# Patient Record
Sex: Male | Born: 1950 | Race: White | Hispanic: No | Marital: Married | Smoking: Former smoker
Health system: Southern US, Community
[De-identification: ages and names within clinical notes are randomized; demographics above are authoritative.]

## PROBLEM LIST (undated history)

## (undated) DIAGNOSIS — E7800 Pure hypercholesterolemia, unspecified: Secondary | ICD-10-CM

## (undated) DIAGNOSIS — E119 Type 2 diabetes mellitus without complications: Secondary | ICD-10-CM

## (undated) DIAGNOSIS — I10 Essential (primary) hypertension: Secondary | ICD-10-CM

---

## 2022-05-24 ENCOUNTER — Emergency Department (HOSPITAL_COMMUNITY): Payer: Medicare Other

## 2022-05-24 ENCOUNTER — Observation Stay (HOSPITAL_COMMUNITY)
Admission: EM | Admit: 2022-05-24 | Discharge: 2022-05-25 | Disposition: A | Discharge status: Home / Self Care | Payer: Medicare Other | Attending: Internal Medicine | Admitting: Internal Medicine

## 2022-05-24 ENCOUNTER — Other Ambulatory Visit: Payer: Self-pay

## 2022-05-24 ENCOUNTER — Encounter (HOSPITAL_COMMUNITY): Payer: Self-pay | Admitting: Emergency Medicine

## 2022-05-24 ENCOUNTER — Emergency Department (HOSPITAL_BASED_OUTPATIENT_CLINIC_OR_DEPARTMENT_OTHER): Payer: Medicare Other

## 2022-05-24 DIAGNOSIS — R200 Anesthesia of skin: Secondary | ICD-10-CM

## 2022-05-24 DIAGNOSIS — I10 Essential (primary) hypertension: Secondary | ICD-10-CM

## 2022-05-24 DIAGNOSIS — I129 Hypertensive chronic kidney disease with stage 1 through stage 4 chronic kidney disease, or unspecified chronic kidney disease: Secondary | ICD-10-CM | POA: Insufficient documentation

## 2022-05-24 DIAGNOSIS — Z7982 Long term (current) use of aspirin: Secondary | ICD-10-CM | POA: Insufficient documentation

## 2022-05-24 DIAGNOSIS — E1122 Type 2 diabetes mellitus with diabetic chronic kidney disease: Secondary | ICD-10-CM | POA: Insufficient documentation

## 2022-05-24 DIAGNOSIS — Z7984 Long term (current) use of oral hypoglycemic drugs: Secondary | ICD-10-CM | POA: Diagnosis not present

## 2022-05-24 DIAGNOSIS — N1832 Chronic kidney disease, stage 3b: Secondary | ICD-10-CM | POA: Diagnosis not present

## 2022-05-24 DIAGNOSIS — G4730 Sleep apnea, unspecified: Secondary | ICD-10-CM

## 2022-05-24 DIAGNOSIS — Z87891 Personal history of nicotine dependence: Secondary | ICD-10-CM | POA: Insufficient documentation

## 2022-05-24 DIAGNOSIS — I639 Cerebral infarction, unspecified: Secondary | ICD-10-CM

## 2022-05-24 DIAGNOSIS — Z79899 Other long term (current) drug therapy: Secondary | ICD-10-CM | POA: Diagnosis not present

## 2022-05-24 DIAGNOSIS — G459 Transient cerebral ischemic attack, unspecified: Principal | ICD-10-CM | POA: Insufficient documentation

## 2022-05-24 HISTORY — DX: Type 2 diabetes mellitus without complications: E11.9

## 2022-05-24 HISTORY — DX: Essential (primary) hypertension: I10

## 2022-05-24 HISTORY — DX: Pure hypercholesterolemia, unspecified: E78.00

## 2022-05-24 LAB — COMPREHENSIVE METABOLIC PANEL
ALT: 20 U/L (ref 0–44)
AST: 20 U/L (ref 15–41)
Albumin: 4.2 g/dL (ref 3.5–5.0)
Alkaline Phosphatase: 80 U/L (ref 38–126)
Anion gap: 12 (ref 5–15)
BUN: 34 mg/dL — ABNORMAL HIGH (ref 8–23)
CO2: 21 mmol/L — ABNORMAL LOW (ref 22–32)
Calcium: 9.8 mg/dL (ref 8.9–10.3)
Chloride: 105 mmol/L (ref 98–111)
Creatinine, Ser: 1.83 mg/dL — ABNORMAL HIGH (ref 0.61–1.24)
GFR, Estimated: 39 mL/min — ABNORMAL LOW (ref >60)
Glucose, Bld: 187 mg/dL — ABNORMAL HIGH (ref 70–99)
Potassium: 4.4 mmol/L (ref 3.5–5.1)
Sodium: 138 mmol/L (ref 135–145)
Total Bilirubin: 0.5 mg/dL (ref 0.3–1.2)
Total Protein: 7.2 g/dL (ref 6.5–8.1)

## 2022-05-24 LAB — CBC WITH DIFFERENTIAL/PLATELET
Abs Immature Granulocytes: 0.03 10*3/uL (ref 0.00–0.07)
Basophils Absolute: 0 10*3/uL (ref 0.0–0.1)
Basophils Relative: 1 %
Eosinophils Absolute: 0.4 10*3/uL (ref 0.0–0.5)
Eosinophils Relative: 7 %
HCT: 47.4 % (ref 39.0–52.0)
Hemoglobin: 15.3 g/dL (ref 13.0–17.0)
Immature Granulocytes: 1 %
Lymphocytes Relative: 18 %
Lymphs Abs: 1.1 10*3/uL (ref 0.7–4.0)
MCH: 29.6 pg (ref 26.0–34.0)
MCHC: 32.3 g/dL (ref 30.0–36.0)
MCV: 91.7 fL (ref 80.0–100.0)
Monocytes Absolute: 0.5 10*3/uL (ref 0.1–1.0)
Monocytes Relative: 8 %
Neutro Abs: 4.1 10*3/uL (ref 1.7–7.7)
Neutrophils Relative %: 65 %
Platelets: 248 10*3/uL (ref 150–400)
RBC: 5.17 MIL/uL (ref 4.22–5.81)
RDW: 12.7 % (ref 11.5–15.5)
WBC: 6.2 10*3/uL (ref 4.0–10.5)
nRBC: 0 % (ref 0.0–0.2)

## 2022-05-24 LAB — PROTIME-INR
INR: 0.9 (ref 0.8–1.2)
Prothrombin Time: 12.5 seconds (ref 11.4–15.2)

## 2022-05-24 LAB — URINALYSIS, ROUTINE W REFLEX MICROSCOPIC
Bacteria, UA: NONE SEEN
Bilirubin Urine: NEGATIVE
Glucose, UA: >=500 mg/dL — AB
Hgb urine dipstick: NEGATIVE
Ketones, ur: NEGATIVE mg/dL
Leukocytes,Ua: NEGATIVE
Nitrite: NEGATIVE
Protein, ur: 100 mg/dL — AB
Specific Gravity, Urine: 1.016 (ref 1.005–1.030)
pH: 6 (ref 5.0–8.0)

## 2022-05-24 LAB — HEMOGLOBIN A1C
Hgb A1c MFr Bld: 8.3 % — ABNORMAL HIGH (ref 4.8–5.6)
Mean Plasma Glucose: 191.51 mg/dL

## 2022-05-24 LAB — CBG MONITORING, ED: Glucose-Capillary: 90 mg/dL (ref 70–99)

## 2022-05-24 LAB — APTT: aPTT: 27 seconds (ref 24–36)

## 2022-05-24 LAB — LIPID PANEL
Cholesterol: 263 mg/dL — ABNORMAL HIGH (ref 0–200)
HDL: 48 mg/dL (ref >40)
LDL Cholesterol: 188 mg/dL — ABNORMAL HIGH (ref 0–99)
Total CHOL/HDL Ratio: 5.5 RATIO
Triglycerides: 133 mg/dL (ref <150)
VLDL: 27 mg/dL (ref 0–40)

## 2022-05-24 LAB — GLUCOSE, CAPILLARY: Glucose-Capillary: 261 mg/dL — ABNORMAL HIGH (ref 70–99)

## 2022-05-24 IMAGING — MR MR MRA HEAD W/O CM
2 series · 19 of 48 positions shown · non-contrast
Comparison: Head CT [DATE]

CLINICAL DATA: Neuro deficit, acute, stroke suspected. Right-sided
numbness.

EXAM:
MRI HEAD WITHOUT CONTRAST
MRA HEAD WITHOUT CONTRAST
TECHNIQUE: Multiplanar, multi-echo pulse sequences of the brain and surrounding
structures were acquired without intravenous contrast. Angiographic
images of the Circle of Willis were acquired using MRA technique
without intravenous contrast.

[Series 2: ax (id) · axial · 1.0mm · 0.43mm/px · z∈[-26,+69]mm · 16 of 200 slices shown]
[im 1/200]
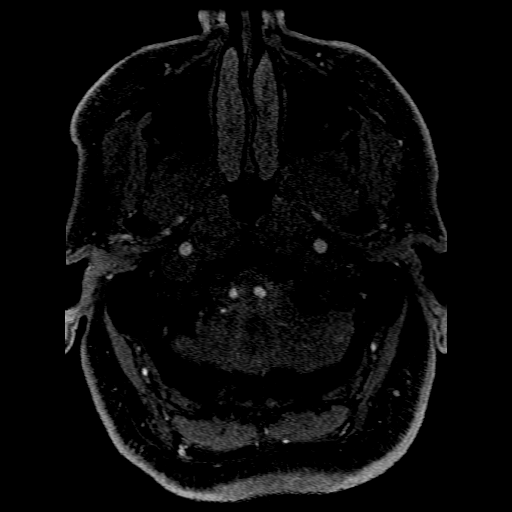
[im 5/200]
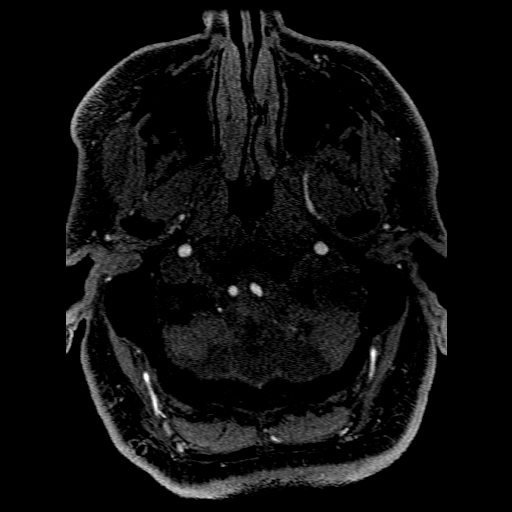
[im 10/200]
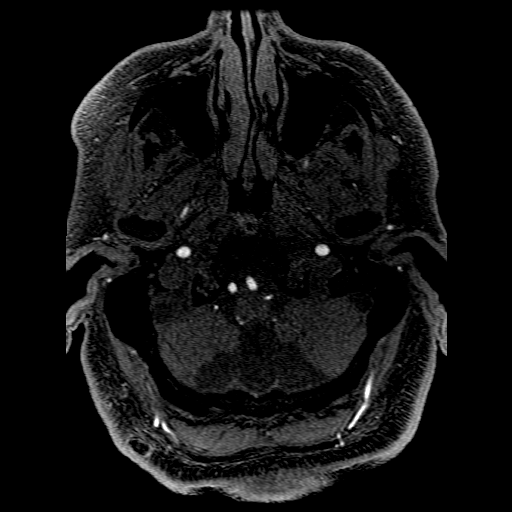
[im 14/200]
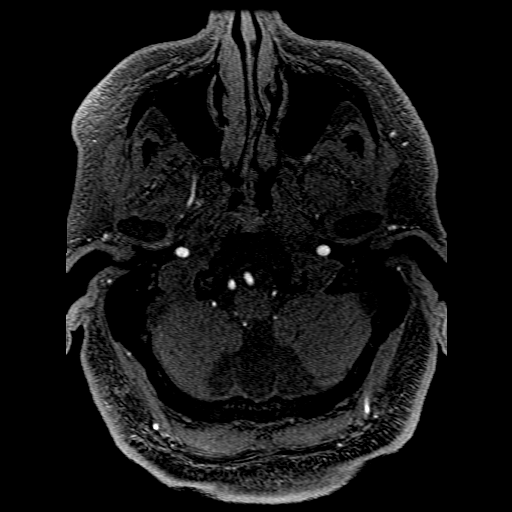
[im 19/200]
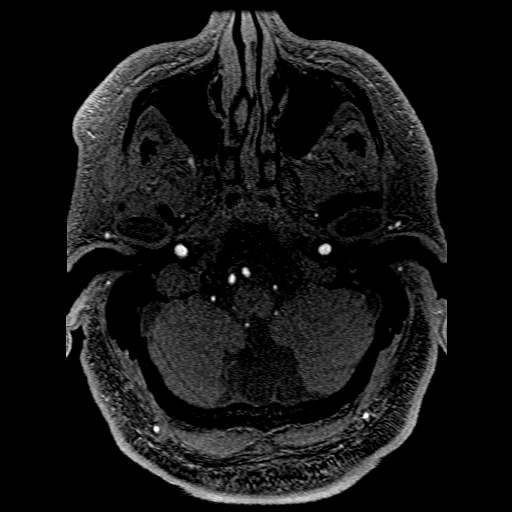
[im 23/200]
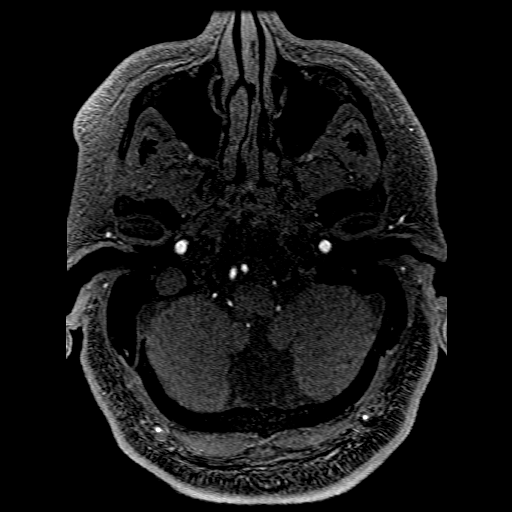
[im 32/200]
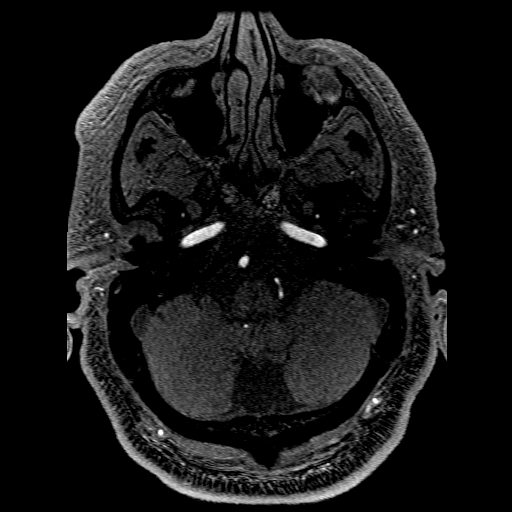
[im 37/200]
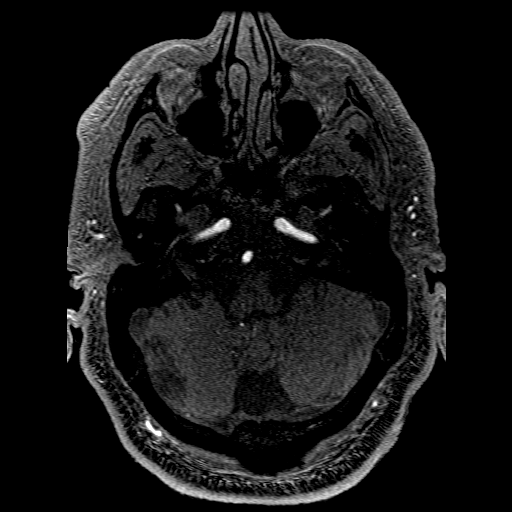
[im 64/200]
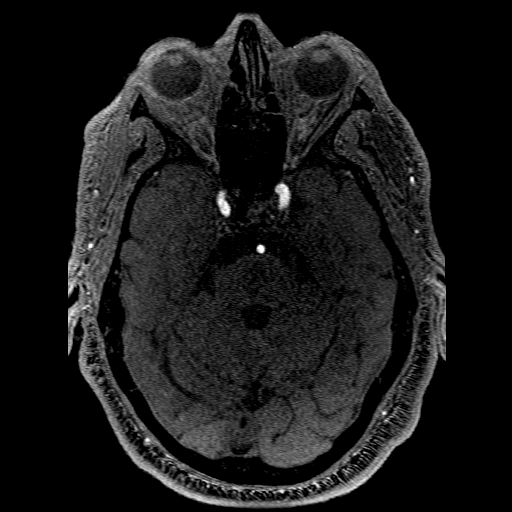
[im 86/200]
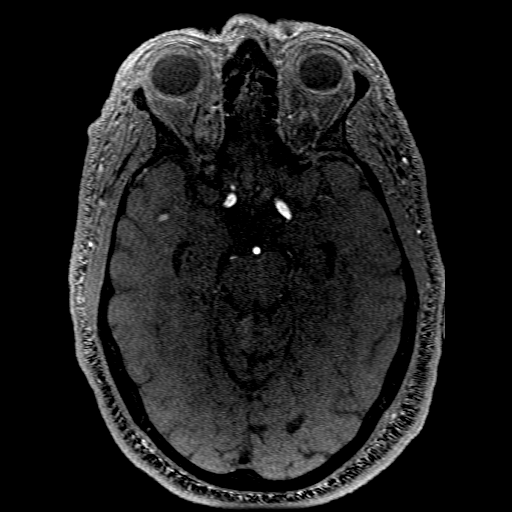
[im 100/200]
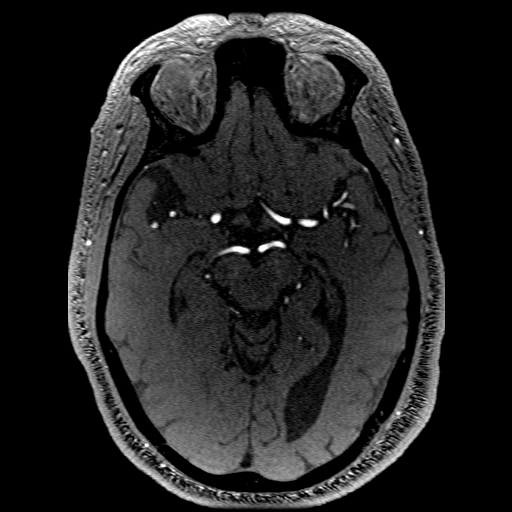
[im 114/200]
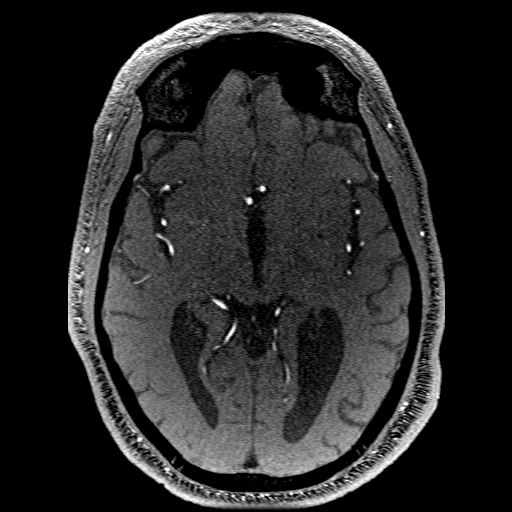
[im 136/200]
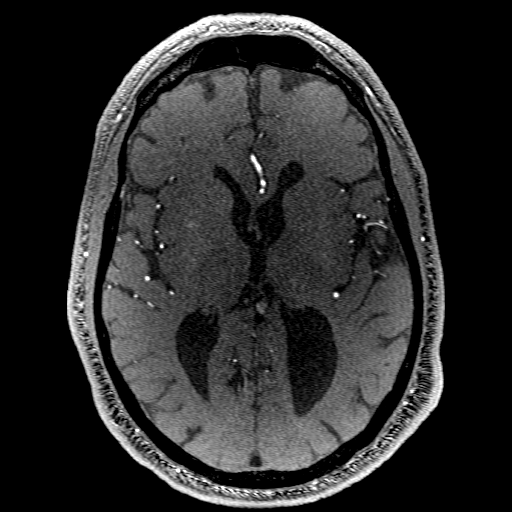
[im 163/200]
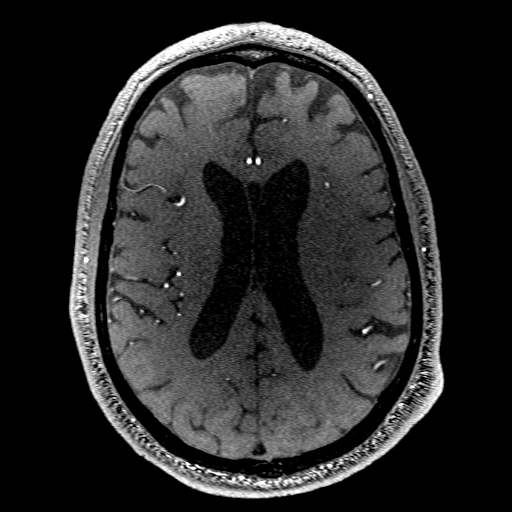
[im 168/200]
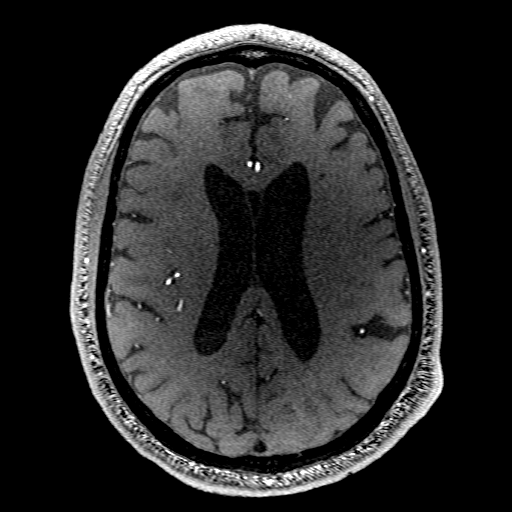
[im 190/200]
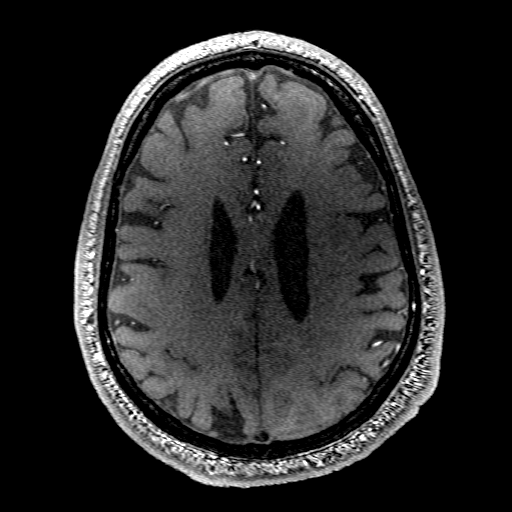

[Series 201: pjn:ax (id) · sagittal · 1.0mm · 0.43mm/px · 3 of 14 slices shown]
[im 1/14]
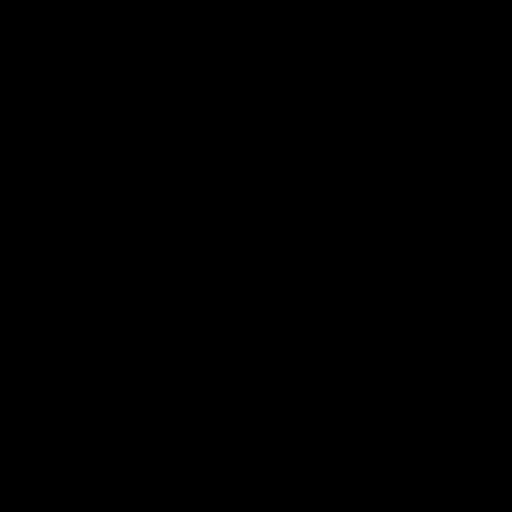
[im 7/14]
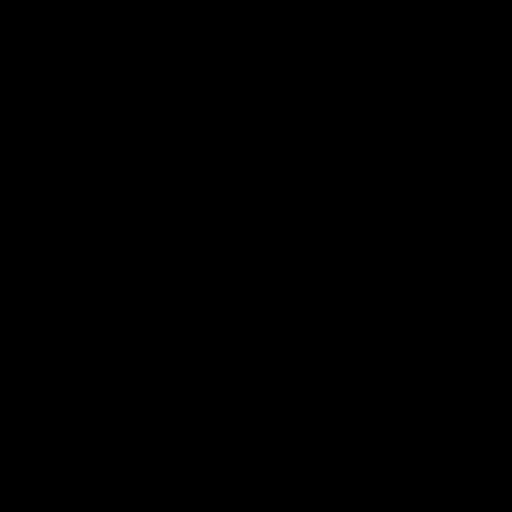
[im 14/14]
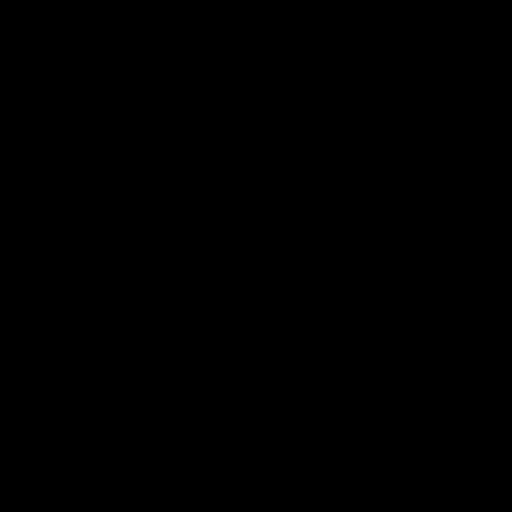

[19 of 48 positions shown; findings below may reference images not displayed]

FINDINGS: MRI HEAD FINDINGS

Brain: There is a 7 mm acute infarct in the inferolateral aspect of
the left thalamus. No intracranial hemorrhage, midline shift, or
extra-axial fluid collection is identified. A small curvilinear
lipoma is noted along the splenium of the corpus callosum. Small T2
hyperintensities scattered throughout the cerebral white matter
bilaterally are nonspecific but compatible with mild to moderate
chronic small vessel ischemic disease. There is mild cerebral
atrophy.

Vascular: Major intracranial vascular flow voids are preserved.

Skull and upper cervical spine: Unremarkable bone marrow signal para

Sinuses/Orbits: Unremarkable orbits. Clear paranasal sinuses. Trace
bilateral mastoid effusions.

Other: None.

MRA HEAD FINDINGS

Anterior circulation: The internal carotid arteries are widely
patent from skull base to carotid termini. ACAs and MCAs are patent
without evidence of a proximal branch occlusion or significant
proximal stenosis. No aneurysm is identified.

Posterior circulation: The included intracranial portions of the
vertebral arteries are widely patent to the basilar. Patent PICAs
are partially visualized bilaterally. SCAs are patent bilaterally.
The basilar artery is widely patent. Posterior communicating
arteries are diminutive or absent. Both PCAs are patent without
evidence of a significant proximal stenosis. No aneurysm is
identified.

Anatomic variants: None.
IMPRESSION: 1. Acute left thalamic infarct.
2. Mild-to-moderate chronic small vessel ischemic disease.
3. Small pericallosal lipoma.
4. Negative head MRA.

## 2022-05-24 IMAGING — MR MR HEAD W/O CM
6 of 11 series · 25 of 48 positions shown · non-contrast
Comparison: Head CT [DATE]

CLINICAL DATA: Neuro deficit, acute, stroke suspected. Right-sided
numbness.

EXAM:
MRI HEAD WITHOUT CONTRAST
MRA HEAD WITHOUT CONTRAST
TECHNIQUE: Multiplanar, multi-echo pulse sequences of the brain and surrounding
structures were acquired without intravenous contrast. Angiographic
images of the Circle of Willis were acquired using MRA technique
without intravenous contrast.

[Series 3: DWI · axial · 3.0mm · 0.94mm/px · z∈[-27,+135]mm · 7 of 109 slices shown (1 of 2)]
[im 1/109]
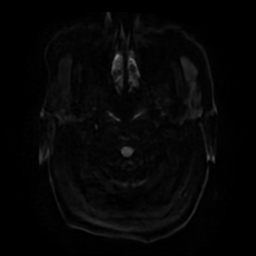
[im 19/109]
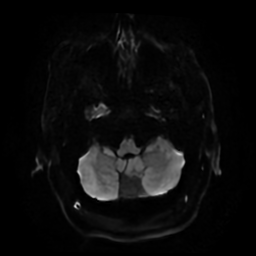
[im 37/109]
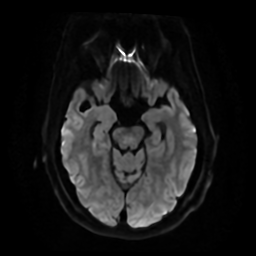
[im 55/109]
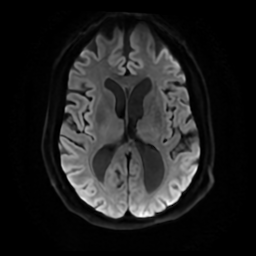
[im 73/109]
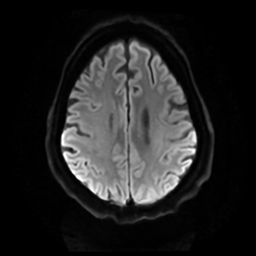
[im 91/109]
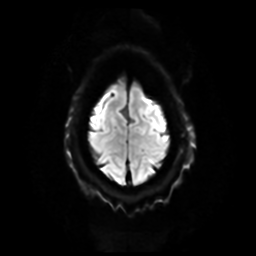
[im 109/109]
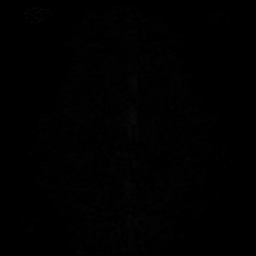

[Series 5: DWI · coronal · 4.0mm · 0.94mm/px · 6 of 80 slices shown (2 of 2)]
[im 1/80]
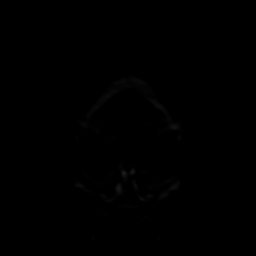
[im 16/80]
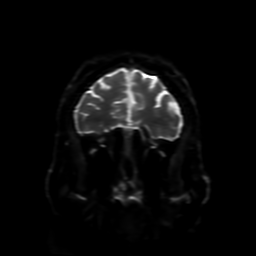
[im 32/80]
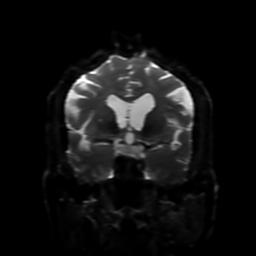
[im 48/80]
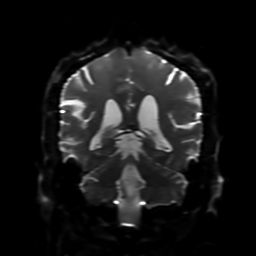
[im 64/80]
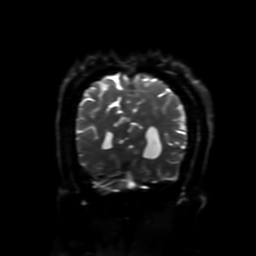
[im 80/80]
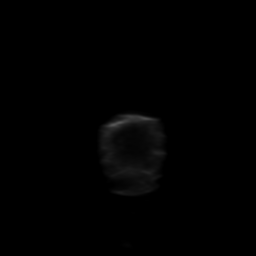

[Series 6: FLAIR · sagittal · 5.0mm · 0.23mm/px · 2 of 28 slices shown (1 of 2)]
[im 1/28]
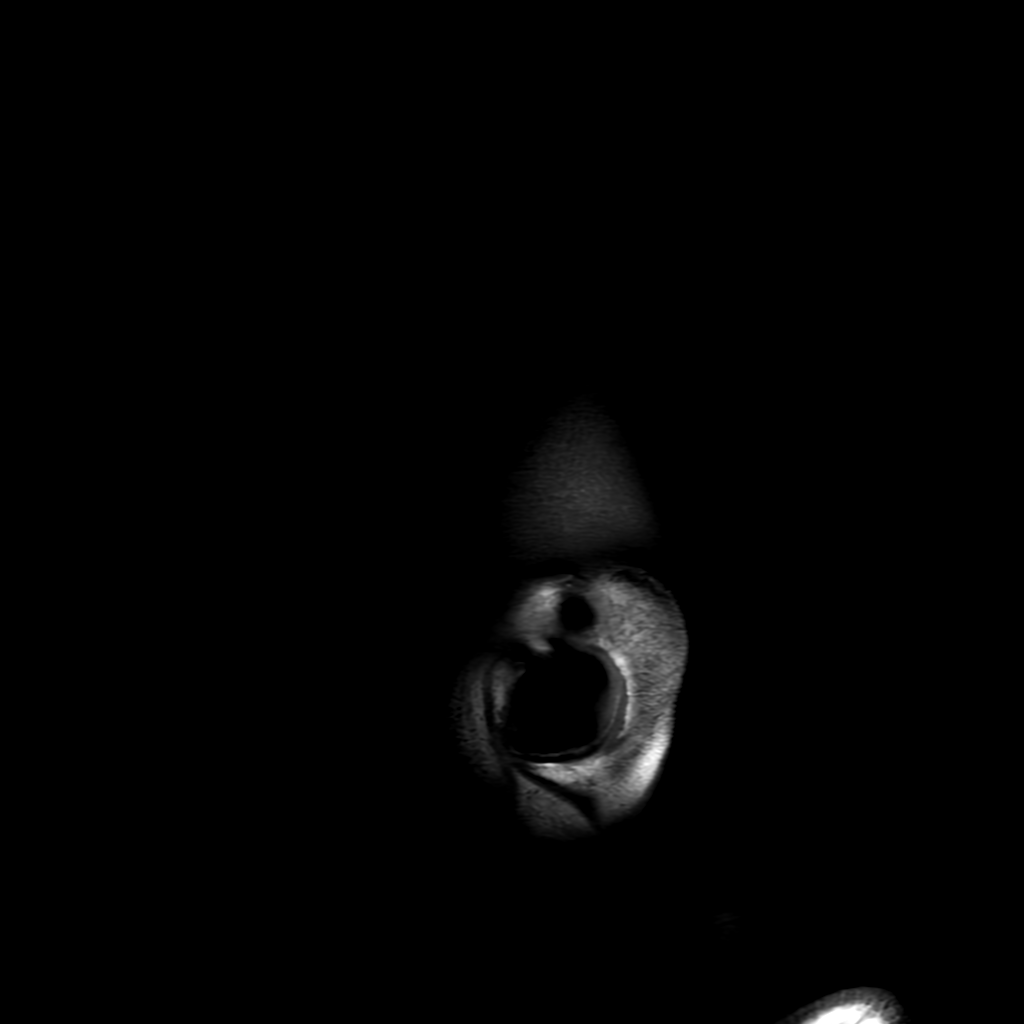
[im 28/28]
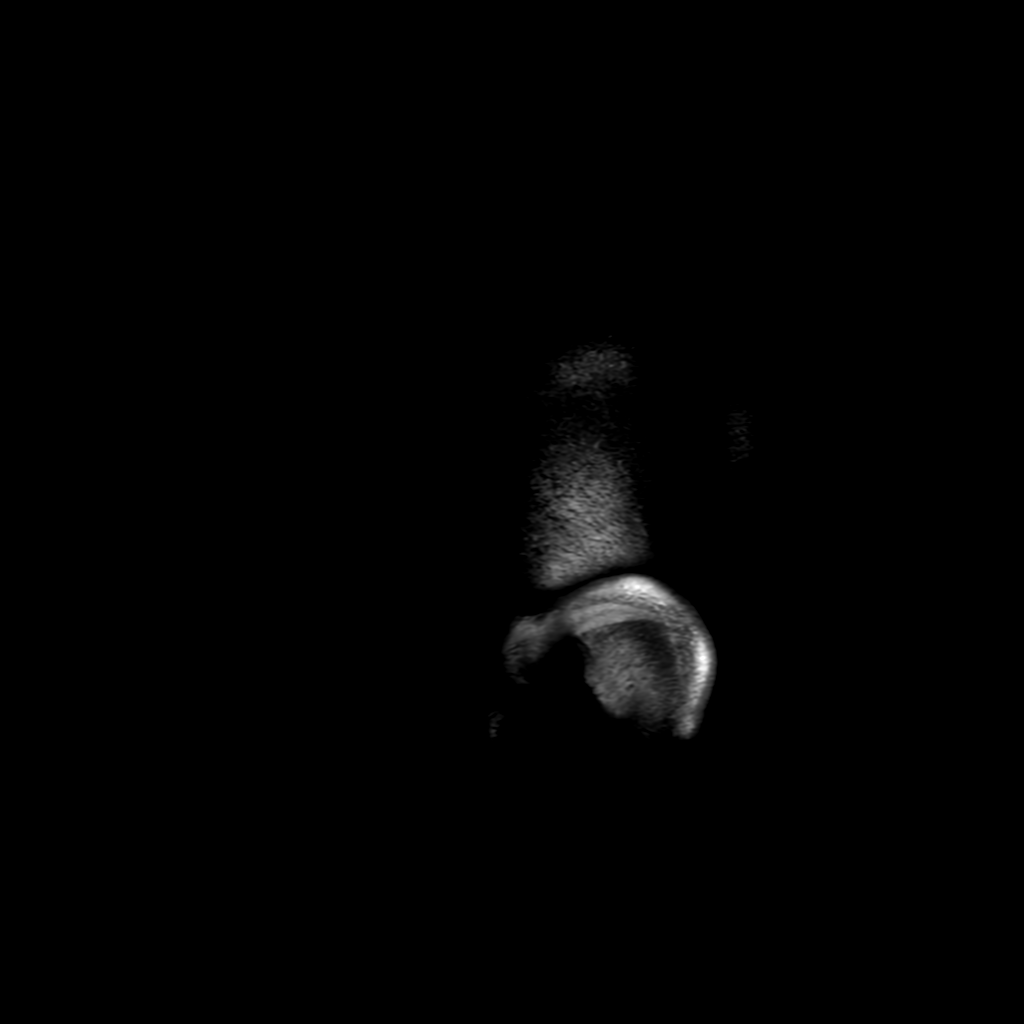

[Series 8: FLAIR · axial · 4.0mm · 0.45mm/px · z∈[-34,+128]mm · 3 of 38 slices shown (2 of 2)]
[im 1/38]
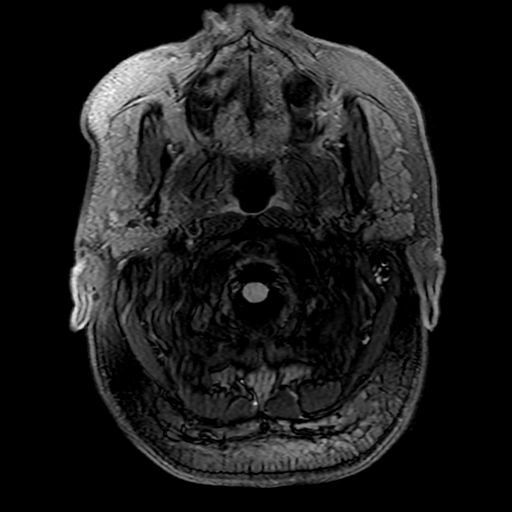
[im 19/38]
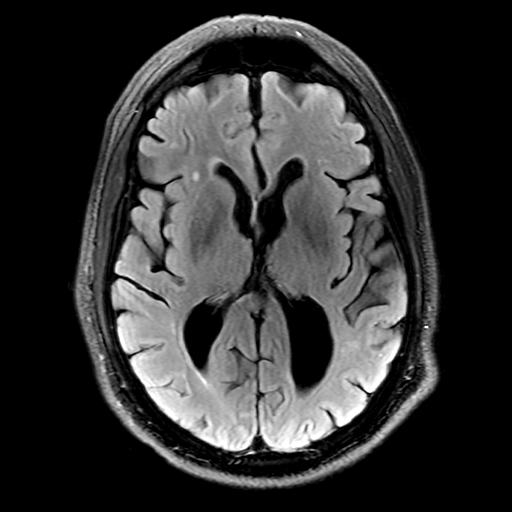
[im 38/38]
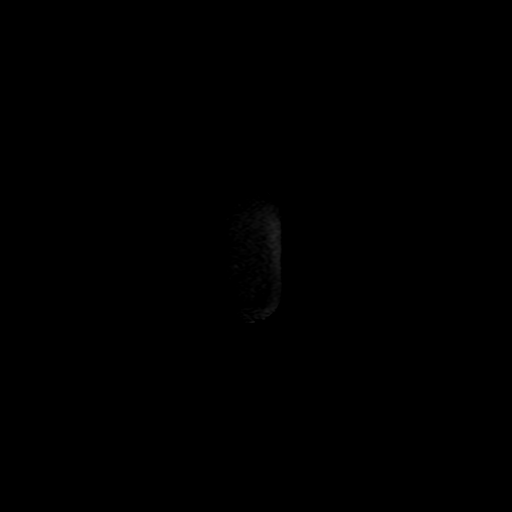

[Series 350: ADC · axial · 3.0mm · 0.94mm/px · z∈[-27,+135]mm · 4 of 55 slices shown (1 of 2)]
[im 1/55]
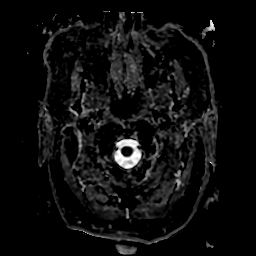
[im 19/55]
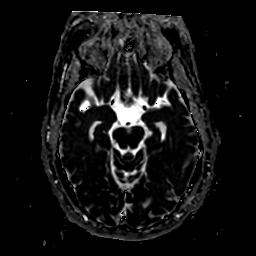
[im 37/55]
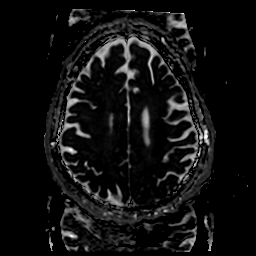
[im 55/55]
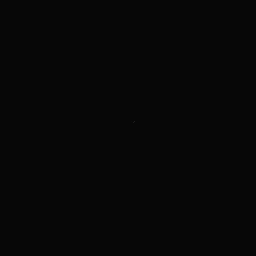

[Series 550: ADC · coronal · 4.0mm · 0.94mm/px · 3 of 40 slices shown (2 of 2)]
[im 1/40]
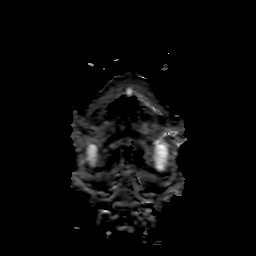
[im 20/40]
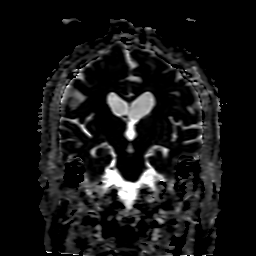
[im 40/40]
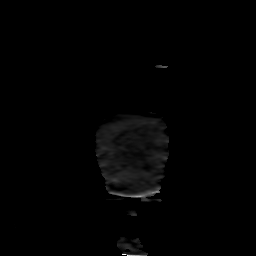

[25 of 48 positions shown; findings below may reference images not displayed]

FINDINGS: MRI HEAD FINDINGS

Brain: There is a 7 mm acute infarct in the inferolateral aspect of
the left thalamus. No intracranial hemorrhage, midline shift, or
extra-axial fluid collection is identified. A small curvilinear
lipoma is noted along the splenium of the corpus callosum. Small T2
hyperintensities scattered throughout the cerebral white matter
bilaterally are nonspecific but compatible with mild to moderate
chronic small vessel ischemic disease. There is mild cerebral
atrophy.

Vascular: Major intracranial vascular flow voids are preserved.

Skull and upper cervical spine: Unremarkable bone marrow signal para

Sinuses/Orbits: Unremarkable orbits. Clear paranasal sinuses. Trace
bilateral mastoid effusions.

Other: None.

MRA HEAD FINDINGS

Anterior circulation: The internal carotid arteries are widely
patent from skull base to carotid termini. ACAs and MCAs are patent
without evidence of a proximal branch occlusion or significant
proximal stenosis. No aneurysm is identified.

Posterior circulation: The included intracranial portions of the
vertebral arteries are widely patent to the basilar. Patent PICAs
are partially visualized bilaterally. SCAs are patent bilaterally.
The basilar artery is widely patent. Posterior communicating
arteries are diminutive or absent. Both PCAs are patent without
evidence of a significant proximal stenosis. No aneurysm is
identified.

Anatomic variants: None.
IMPRESSION: 1. Acute left thalamic infarct.
2. Mild-to-moderate chronic small vessel ischemic disease.
3. Small pericallosal lipoma.
4. Negative head MRA.

## 2022-05-24 IMAGING — CT CT HEAD W/O CM
4 series · 16 of 47 positions shown, 18 images · non-contrast
Comparison: None Available.

CLINICAL DATA: TIA, right leg numbness



[Series 3: head wo · axial · 0.46mm/px · z∈[-110,+10]mm · 7 of 34 slices shown, 9 images]
[im 5/34  brain]
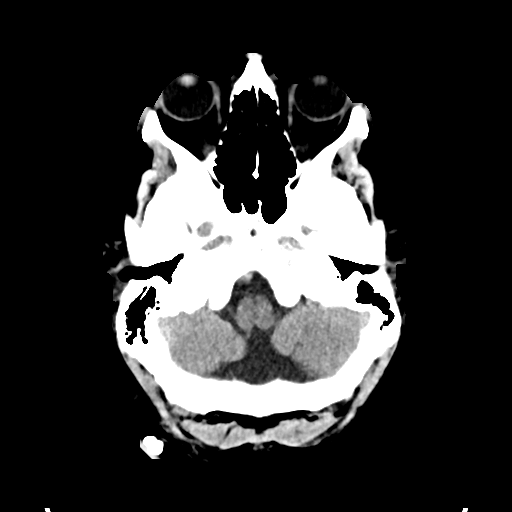
[im 5/34  bone]
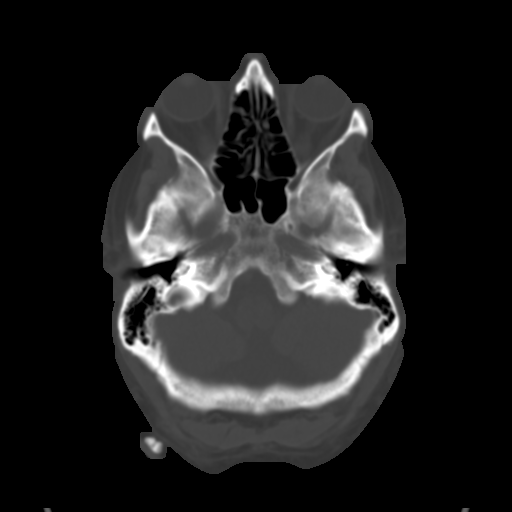
[im 9/34  brain]
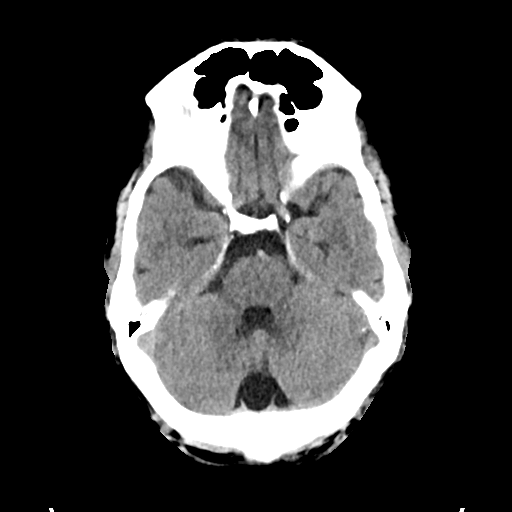
[im 13/34  brain]
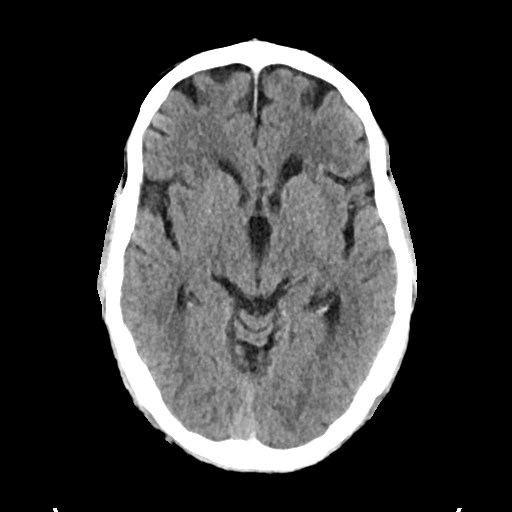
[im 17/34  brain]
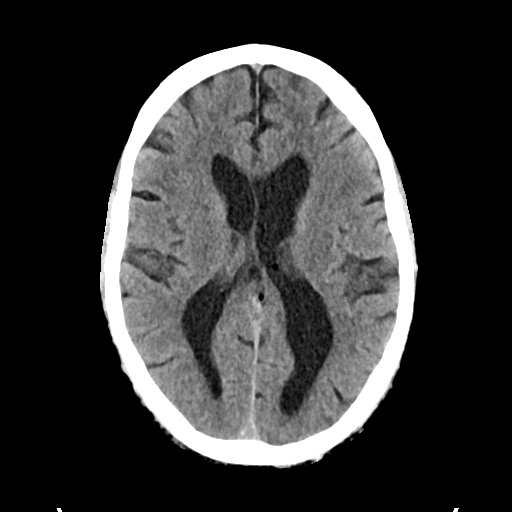
[im 21/34  brain]
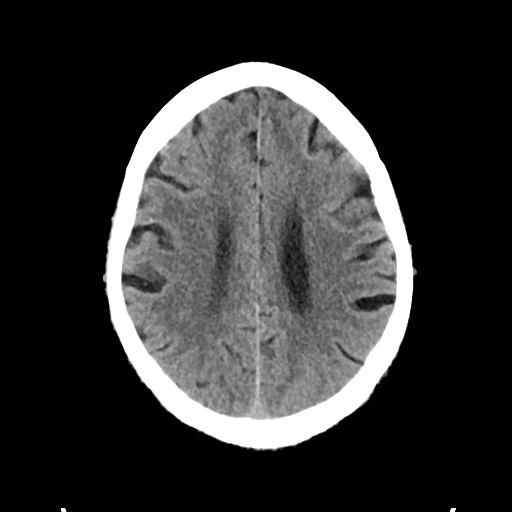
[im 21/34  bone]
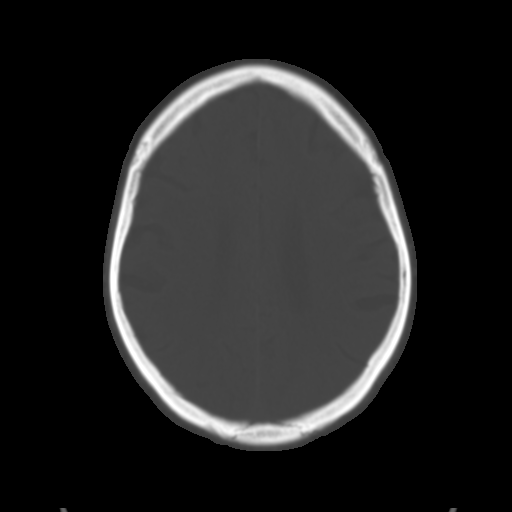
[im 25/34  brain]
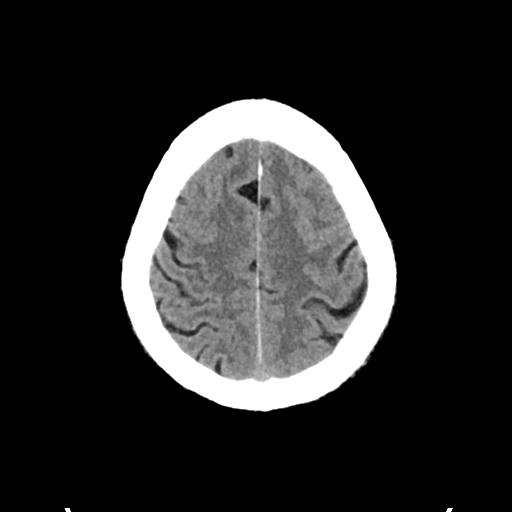
[im 29/34  brain]
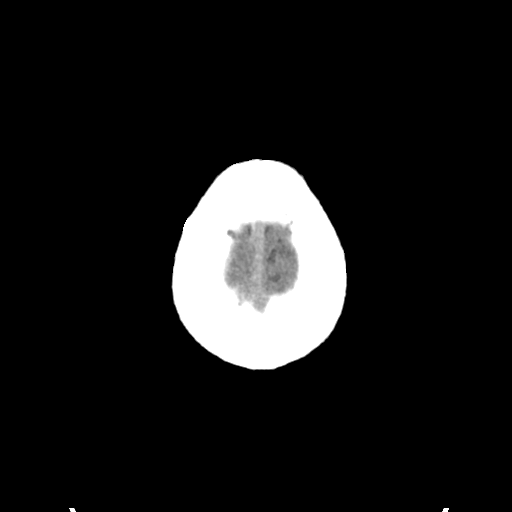

[Series 4: head bone · axial · 0.46mm/px · z∈[-114,-82]mm · 3 of 83 slices shown]
[im 9/83  bone]
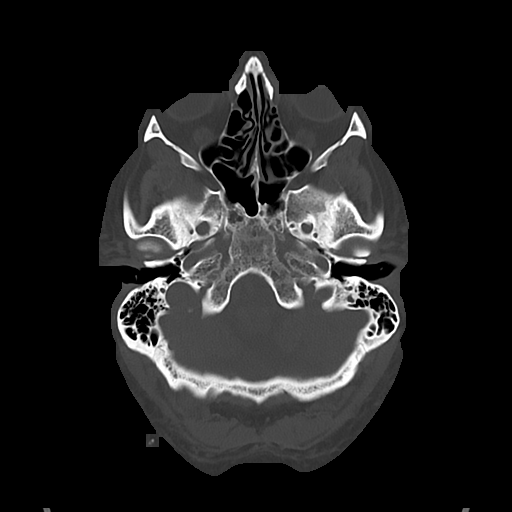
[im 17/83  bone]
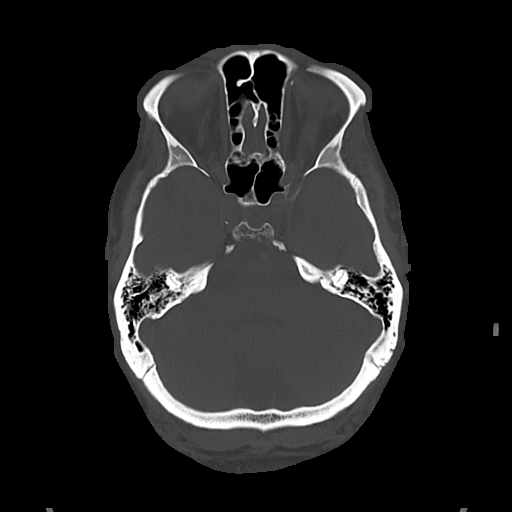
[im 25/83  bone]
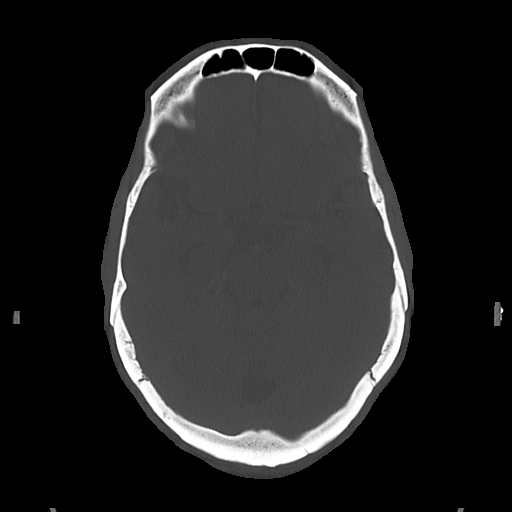

[Series 5: cor soft · coronal · 0.32mm/px · 3 of 76 slices shown]
[im 26/76  brain]
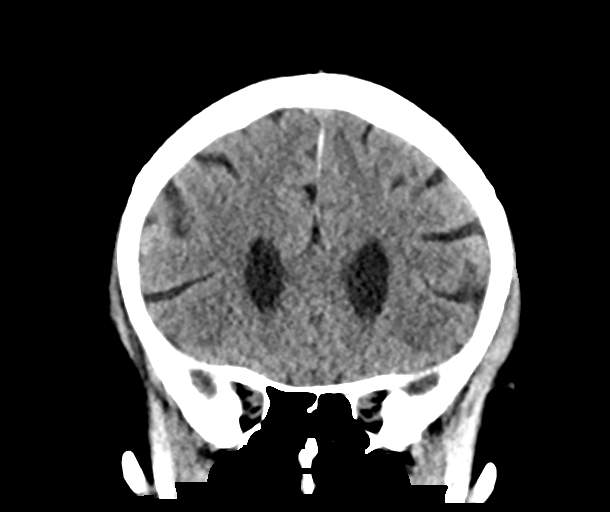
[im 34/76  brain]
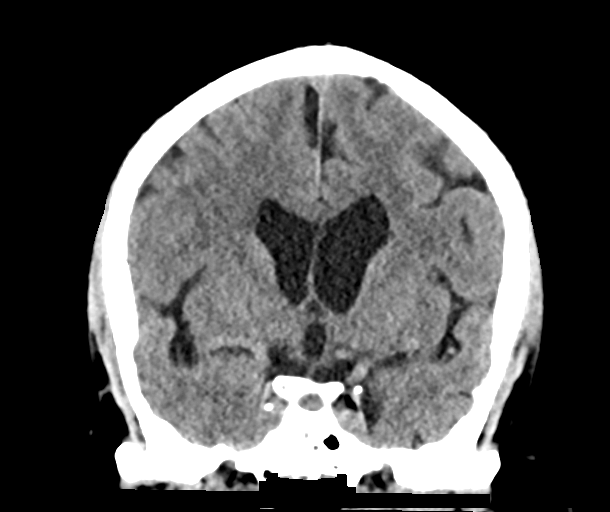
[im 42/76  brain]
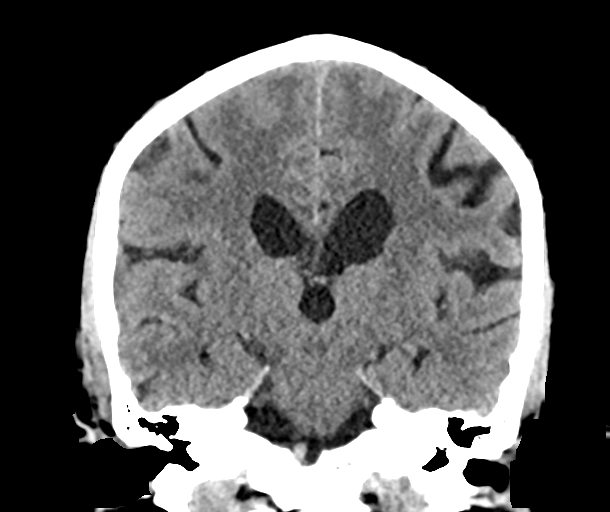

[Series 6: sag soft · sagittal · 0.33mm/px · 3 of 63 slices shown]
[im 21/63  brain]
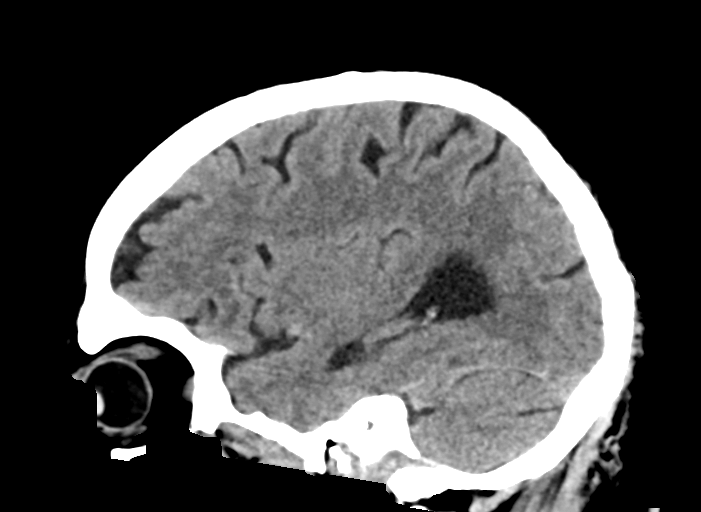
[im 32/63  brain]
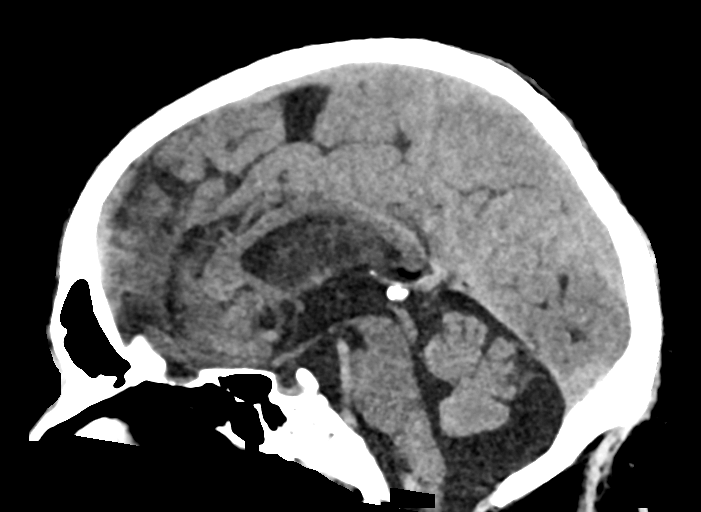
[im 42/63  brain]
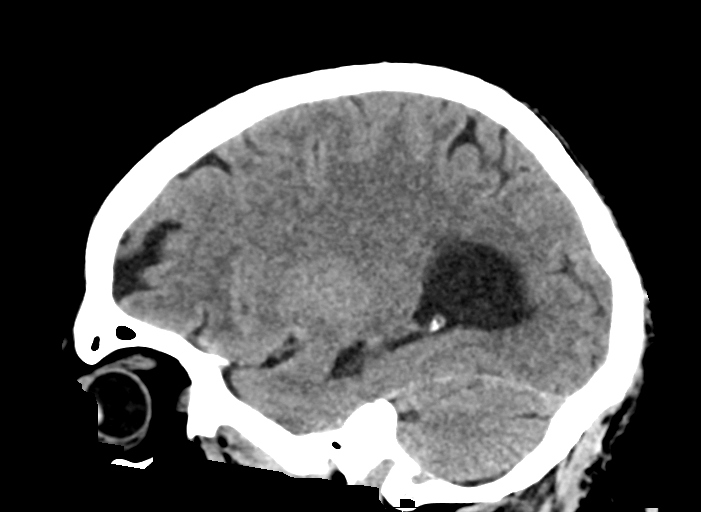

[16 of 47 positions shown; findings below may reference images not displayed]

FINDINGS: Brain: No evidence of acute infarction, hemorrhage, hydrocephalus,
extra-axial collection or mass lesion/mass effect.

Vascular: No hyperdense vessel or unexpected calcification.

Skull: Normal. Negative for fracture or focal lesion.

Sinuses/Orbits: No acute finding.

Other: None.
IMPRESSION: No acute intracranial pathology. No non-contrast CT findings to
explain right-sided numbness. Consider MRI to more sensitively
evaluate for acute diffusion restricting infarction.

## 2022-05-24 MED ORDER — ENOXAPARIN SODIUM 30 MG/0.3ML IJ SOSY
30.0000 mg | PREFILLED_SYRINGE | INTRAMUSCULAR | Status: DC
  Administered 2022-05-25: 30 mg via SUBCUTANEOUS
  Filled 2022-05-24: qty 0.3

## 2022-05-24 MED ORDER — INSULIN ASPART 100 UNIT/ML IJ SOLN
0.0000 [IU] | Freq: Three times a day (TID) | INTRAMUSCULAR | Status: DC
  Administered 2022-05-25: 3 [IU] via SUBCUTANEOUS
  Administered 2022-05-25: 2 [IU] via SUBCUTANEOUS
  Administered 2022-05-25: 3 [IU] via SUBCUTANEOUS

## 2022-05-24 MED ORDER — ASPIRIN 81 MG PO CHEW
81.0000 mg | CHEWABLE_TABLET | Freq: Every day | ORAL | Status: DC
  Administered 2022-05-25: 81 mg via ORAL
  Filled 2022-05-24: qty 1

## 2022-05-24 MED ORDER — INSULIN ASPART 100 UNIT/ML IJ SOLN
0.0000 [IU] | Freq: Every day | INTRAMUSCULAR | Status: DC
  Administered 2022-05-24: 3 [IU] via SUBCUTANEOUS

## 2022-05-24 MED ORDER — CLOPIDOGREL BISULFATE 75 MG PO TABS
75.0000 mg | ORAL_TABLET | Freq: Every day | ORAL | Status: DC
  Administered 2022-05-24 – 2022-05-25 (×2): 75 mg via ORAL
  Filled 2022-05-24 (×2): qty 1

## 2022-05-24 NOTE — ED Notes (Signed)
Patient back from MRI.

## 2022-05-24 NOTE — H&P (Signed)
History and Physical    Michael Knapp [id number] DOB: 09-19-1951 DOA: 05/24/2022  PCP: Garnetta Buddy I, NP  Patient coming from: Home  I have personally briefly reviewed patient's old medical records available.   Chief Complaint: Left-sided numbness and tingling.  1 day.  HPI: Michael Knapp is a 71 y.o. male with medical history significant of essential hypertension, type 2 diabetes, CKD stage IIIb, hyperlipidemia and sleep apnea presented to the emergency room with 1 day history of right-sided numbness.  Started yesterday afternoon, is spontaneously resolved and recurred at nighttime.  Initial symptoms were on the right upper and lower extremity of the body, however recurred symptoms were on the right arm.  He went to bed with the symptoms.  Woke up with persistent numbness so came to ER. Denies any nausea vomiting.  Denies any headache.  Denies any syncopal episodes.  Without any drooling, facial asymmetry, blurry vision. ED Course: Hemodynamically stable.  No obvious focal neurological deficits.  CT head with no acute findings.  Electrolytes normal.  Creatinine 1.8.  Does have history of CKD stage IIIb.  Seen by neurology in the emergency room.  Admitted for TIA work-up.  Remains fairly stable on my interview.  Review of Systems: all systems are reviewed and pertinent positive as per HPI otherwise rest are negative.    Past Medical History:  Diagnosis Date   Diabetes mellitus without complication (HCC)    High cholesterol    Hypertension     History reviewed. No pertinent surgical history.  Social history   reports that he has quit smoking. His smoking use included cigarettes. He has never used smokeless tobacco. He reports that he does not currently use alcohol. He reports that he does not currently use drugs.  No Known Allergies  History reviewed. No pertinent family history.   Prior to Admission medications   Medication Sig Start Date End Date Taking? Authorizing Provider   amLODipine (NORVASC) 5 MG tablet Take 5 mg by mouth daily. 04/08/22  Yes [provider]  aspirin EC 81 MG tablet Take 81 mg by mouth daily. Swallow whole.   Yes [provider]  FARXIGA 10 MG TABS tablet Take 10 mg by mouth daily. 04/30/22  Yes [provider]  glipiZIDE (GLUCOTROL) 10 MG tablet Take 10 mg by mouth 2 (two) times daily. 05/06/22  Yes [provider]  losartan (COZAAR) 100 MG tablet Take 100 mg by mouth daily. 04/08/22  Yes [provider]  lovastatin (MEVACOR) 40 MG tablet Take 40 mg by mouth daily. 05/24/22  Yes [provider]  metFORMIN (GLUCOPHAGE) 500 MG tablet Take 1,000 mg by mouth 2 (two) times daily.   Yes [provider]  Vitamin D, Ergocalciferol, (DRISDOL) 1.25 MG (50000 UNIT) CAPS capsule Take 50,000 Units by mouth every Saturday. 04/30/22  Yes [provider]    Physical Exam: Vitals:   05/24/22 1430 05/24/22 1500 05/24/22 1530 05/24/22 1715  BP: (!) 169/86 (!) 166/86 (!) 175/90 (!) 161/76  Pulse: 67 67 75 66  Resp: 13 (!) 21 16 13   Temp:      TempSrc:      SpO2: 100% 98% 98% 99%  Weight:      Height:        Constitutional: NAD, calm, comfortable Vitals:   05/24/22 1430 05/24/22 1500 05/24/22 1530 05/24/22 1715  BP: (!) 169/86 (!) 166/86 (!) 175/90 (!) 161/76  Pulse: 67 67 75 66  Resp: 13 (!) 21 16 13  Temp:      TempSrc:      SpO2: 100% 98% 98% 99%  Weight:      Height:       Eyes: PERRL, lids and conjunctivae normal ENMT: Mucous membranes are moist. Posterior pharynx clear of any exudate or lesions.Normal dentition.  Neck: normal, supple, no masses, no thyromegaly Respiratory: clear to auscultation bilaterally, no wheezing, no crackles. Normal respiratory effort. No accessory muscle use.  Cardiovascular: Regular rate and rhythm, no murmurs / rubs / gallops. No extremity edema. 2+ pedal pulses. No carotid bruits.  Abdomen: no tenderness, no masses palpated. No hepatosplenomegaly.  Bowel sounds positive.  Musculoskeletal: no clubbing / cyanosis. No joint deformity upper and lower extremities. Good ROM, no contractures. Normal muscle tone.  Skin: no rashes, lesions, ulcers. No induration Neurologic: CN 2-12 grossly intact. Sensation intact, DTR normal. Strength 5/5 in all 4.  Does have some subjective weakness on the right upper and lower extremity. Psychiatric: Normal judgment and insight. Alert and oriented x 3. Normal mood.     Labs on Admission: I have personally reviewed following labs and imaging studies  CBC: Recent Labs  Lab 05/24/22 0922  WBC 6.2  NEUTROABS 4.1  HGB 15.3  HCT 47.4  MCV 91.7  PLT [id number]   Basic Metabolic Panel: Recent Labs  Lab 05/24/22 0922  NA 138  K 4.4  CL 105  CO2 21*  GLUCOSE 187*  BUN 34*  CREATININE 1.83*  CALCIUM 9.8   GFR: Estimated Creatinine Clearance: 44 mL/min (A) (by C-G formula based on SCr of 1.83 mg/dL (H)). Liver Function Tests: Recent Labs  Lab 05/24/22 0922  AST 20  ALT 20  ALKPHOS 80  BILITOT 0.5  PROT 7.2  ALBUMIN 4.2   No results for input(s): "LIPASE", "AMYLASE" in the last 168 hours. No results for input(s): "AMMONIA" in the last 168 hours. Coagulation Profile: Recent Labs  Lab 05/24/22 0922  INR 0.9   Cardiac Enzymes: No results for input(s): "CKTOTAL", "CKMB", "CKMBINDEX", "TROPONINI" in the last 168 hours. BNP (last 3 results) No results for input(s): "PROBNP" in the last 8760 hours. HbA1C: Recent Labs    05/24/22 1723  HGBA1C 8.3*   CBG: Recent Labs  Lab 05/24/22 1725  GLUCAP 90   Lipid Profile: No results for input(s): "CHOL", "HDL", "LDLCALC", "TRIG", "CHOLHDL", "LDLDIRECT" in the last 72 hours. Thyroid Function Tests: No results for input(s): "TSH", "T4TOTAL", "FREET4", "T3FREE", "THYROIDAB" in the last 72 hours. Anemia Panel: No results for input(s): "VITAMINB12", "FOLATE", "FERRITIN", "TIBC", "IRON", "RETICCTPCT" in the last 72 hours. Urine analysis: No results  found for: "COLORURINE", "APPEARANCEUR", "LABSPEC", "PHURINE", "GLUCOSEU", "HGBUR", "BILIRUBINUR", "KETONESUR", "PROTEINUR", "UROBILINOGEN", "NITRITE", "LEUKOCYTESUR"  Radiological Exams on Admission: VAS US CAROTID  Result Date: 05/24/2022 Carotid Arterial Duplex Study Patient Name:  Michael Knapp  Date of Exam:   05/24/2022 Medical Rec #: [id number]      Accession #:    [id number] Date of Birth: November 05, 1951      Patient Gender: M Patient Age:   54 years Exam Location:  [facility] Procedure:      VAS US CAROTID Referring Phys: Elwin Sleight [place]OR[place] --------------------------------------------------------------------------------  Indications:  CVA. Risk Factors: Hypertension, hyperlipidemia, Diabetes. Performing Technologist: Bobetta Lime  Examination Guidelines: A complete evaluation includes B-mode imaging, spectral Doppler, color Doppler, and power Doppler as needed of all accessible portions of each vessel. Bilateral testing is considered an integral part of a complete examination. Limited examinations for reoccurring indications may be performed  as noted.  Right Carotid Findings: +----------+--------+--------+--------+------------------+--------+           PSV cm/sEDV cm/sStenosisPlaque DescriptionComments +----------+--------+--------+--------+------------------+--------+ CCA Prox  79      12                                         +----------+--------+--------+--------+------------------+--------+ CCA Distal93      19                                         +----------+--------+--------+--------+------------------+--------+ ICA Prox  70      12              heterogenous               +----------+--------+--------+--------+------------------+--------+ ECA       108     20                                         +----------+--------+--------+--------+------------------+--------+ +----------+--------+-------+----------------+-------------------+           PSV cm/sEDV  cmsDescribe        Arm Pressure (mmHG) +----------+--------+-------+----------------+-------------------+ [id number]            Multiphasic, WNL                    +----------+--------+-------+----------------+-------------------+ +---------+--------+--+--------+--+---------+ VertebralPSV cm/s46EDV cm/s12Antegrade +---------+--------+--+--------+--+---------+  Left Carotid Findings: +----------+--------+--------+--------+------------------+--------+           PSV cm/sEDV cm/sStenosisPlaque DescriptionComments +----------+--------+--------+--------+------------------+--------+ CCA Prox  97      23                                         +----------+--------+--------+--------+------------------+--------+ CCA Distal100     24                                         +----------+--------+--------+--------+------------------+--------+ ICA Prox  62      17              heterogenous               +----------+--------+--------+--------+------------------+--------+ ICA Distal40      10                                         +----------+--------+--------+--------+------------------+--------+ ECA       98      9                                          +----------+--------+--------+--------+------------------+--------+ +----------+--------+--------+----------------+-------------------+           PSV cm/sEDV cm/sDescribe        Arm Pressure (mmHG) +----------+--------+--------+----------------+-------------------+ Subclavian                Multiphasic, WNL                    +----------+--------+--------+----------------+-------------------+ +---------+--------+--+--------+--+---------+  VertebralPSV cm/s42EDV cm/s12Antegrade +---------+--------+--+--------+--+---------+   Summary: Right Carotid: Velocities in the right ICA are consistent with a 1-39% stenosis. Left Carotid: Velocities in the left ICA are consistent with a 1-39% stenosis. Vertebrals:   Bilateral vertebral arteries demonstrate antegrade flow. Subclavians: Normal flow hemodynamics were seen in bilateral subclavian              arteries. *See table(s) above for measurements and observations.     Preliminary    MR BRAIN WO CONTRAST  Result Date: 05/24/2022 CLINICAL DATA:  Neuro deficit, acute, stroke suspected. Right-sided numbness. EXAM: MRI HEAD WITHOUT CONTRAST MRA HEAD WITHOUT CONTRAST TECHNIQUE: Multiplanar, multi-echo pulse sequences of the brain and surrounding structures were acquired without intravenous contrast. Angiographic images of the Circle of Willis were acquired using MRA technique without intravenous contrast. COMPARISON:  Head CT 05/24/2022 FINDINGS: MRI HEAD FINDINGS Brain: There is a 7 mm acute infarct in the inferolateral aspect of the left thalamus. No intracranial hemorrhage, midline shift, or extra-axial fluid collection is identified. A small curvilinear lipoma is noted along the splenium of the corpus callosum. Small T2 hyperintensities scattered throughout the cerebral white matter bilaterally are nonspecific but compatible with mild to moderate chronic small vessel ischemic disease. There is mild cerebral atrophy. Vascular: Major intracranial vascular flow voids are preserved. Skull and upper cervical spine: Unremarkable bone marrow signal para Sinuses/Orbits: Unremarkable orbits. Clear paranasal sinuses. Trace bilateral mastoid effusions. Other: None. MRA HEAD FINDINGS Anterior circulation: The internal carotid arteries are widely patent from skull base to carotid termini. ACAs and MCAs are patent without evidence of a proximal branch occlusion or significant proximal stenosis. No aneurysm is identified. Posterior circulation: The included intracranial portions of the vertebral arteries are widely patent to the basilar. Patent PICAs are partially visualized bilaterally. SCAs are patent bilaterally. The basilar artery is widely patent. Posterior communicating arteries  are diminutive or absent. Both PCAs are patent without evidence of a significant proximal stenosis. No aneurysm is identified. Anatomic variants: None. IMPRESSION: 1. Acute left thalamic infarct. 2. 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 3. Small pericallosal lipoma. 4. Negative head MRA. Electronically Signed   By: Logan Bores M.D.   On: 05/24/2022 16:47   MR ANGIO HEAD WO CONTRAST  Result Date: 05/24/2022 CLINICAL DATA:  Neuro deficit, acute, stroke suspected. Right-sided numbness. EXAM: MRI HEAD WITHOUT CONTRAST MRA HEAD WITHOUT CONTRAST TECHNIQUE: Multiplanar, multi-echo pulse sequences of the brain and surrounding structures were acquired without intravenous contrast. Angiographic images of the Circle of Willis were acquired using MRA technique without intravenous contrast. COMPARISON:  Head CT 05/24/2022 FINDINGS: MRI HEAD FINDINGS Brain: There is a 7 mm acute infarct in the inferolateral aspect of the left thalamus. No intracranial hemorrhage, midline shift, or extra-axial fluid collection is identified. A small curvilinear lipoma is noted along the splenium of the corpus callosum. Small T2 hyperintensities scattered throughout the cerebral white matter bilaterally are nonspecific but compatible with mild to moderate chronic small vessel ischemic disease. There is mild cerebral atrophy. Vascular: Major intracranial vascular flow voids are preserved. Skull and upper cervical spine: Unremarkable bone marrow signal para Sinuses/Orbits: Unremarkable orbits. Clear paranasal sinuses. Trace bilateral mastoid effusions. Other: None. MRA HEAD FINDINGS Anterior circulation: The internal carotid arteries are widely patent from skull base to carotid termini. ACAs and MCAs are patent without evidence of a proximal branch occlusion or significant proximal stenosis. No aneurysm is identified. Posterior circulation: The included intracranial portions of the vertebral arteries are widely patent to the  basilar. Patent  PICAs are partially visualized bilaterally. SCAs are patent bilaterally. The basilar artery is widely patent. Posterior communicating arteries are diminutive or absent. Both PCAs are patent without evidence of a significant proximal stenosis. No aneurysm is identified. Anatomic variants: None. IMPRESSION: 1. Acute left thalamic infarct. 2. 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 3. Small pericallosal lipoma. 4. Negative head MRA. Electronically Signed   By: Logan Bores M.D.   On: 05/24/2022 16:47   CT Head Wo Contrast  Result Date: 05/24/2022 CLINICAL DATA:  TIA, right leg numbness EXAM: CT HEAD WITHOUT CONTRAST TECHNIQUE: Contiguous axial images were obtained from the base of the skull through the vertex without intravenous contrast. RADIATION DOSE REDUCTION: This exam was performed according to the departmental dose-optimization program which includes automated exposure control, adjustment of the mA and/or kV according to patient size and/or use of iterative reconstruction technique. COMPARISON:  None Available. FINDINGS: Brain: No evidence of acute infarction, hemorrhage, hydrocephalus, extra-axial collection or mass lesion/mass effect. Vascular: No hyperdense vessel or unexpected calcification. Skull: Normal. Negative for fracture or focal lesion. Sinuses/Orbits: No acute finding. Other: None. IMPRESSION: No acute intracranial pathology. No non-contrast CT findings to explain right-sided numbness. Consider MRI to more sensitively evaluate for acute diffusion restricting infarction. Electronically Signed   By: Delanna Ahmadi M.D.   On: 05/24/2022 10:22    EKG: Independently reviewed.  Sinus rhythm, no acute ST-T wave changes.  Low voltage EKG.  Assessment/Plan Principal Problem:   TIA (transient ischemic attack) Active Problems:   Type 2 diabetes mellitus with chronic kidney disease, without long-term current use of insulin (HCC)   Essential hypertension   Sleep  apnea     1.  TIA: Admit to monitored unit because of severity of symptoms. Neurochecks and vital signs as per stroke protocol. Patient was not a TPA and vascular intervention candidate because of mild symptoms. Diet, eating at home.  Diabetic diet. Antiplatelets, on aspirin at home.  Started on aspirin and Plavix. Statin, on Pravachol 40 mg at home, continue. Blood pressure goals, normotensive.  No indication for permissive hypertension. Consultations, neurology, speech, PT OT MRI of the brain, results pending. MRA head , results pending. Carotid ultrasound, results are pending. 2D echocardiogram, ordered. Hemoglobin A1c, lipid profile pending.  2.  Essential hypertension: Blood pressure is stable.  No indication for permissive hypertension.  We will resume his home medications including losartan and amlodipine.  3.  Type 2 diabetes, uncontrolled.  Last A1c 12.  On oral hypoglycemics: Can resume glipizide.  Will hold metformin in case he has to have contrasted studies.  Checking A1c.  Keep on sliding scale insulin along with glipizide.  We will discuss about additional medications on discharge.  4.  Obstructive sleep apnea: Use CPAP at night.  He can use his CPAP from home if available.  5.  Hyperlipidemia: LDL pending.  Last LDL 139.  On lovastatin.   DVT prophylaxis: Lovenox Code Status: Full code Family Communication: None Disposition Plan: Home, anticipate tomorrow Consults called: Neurology Admission status: Observation, medical telemetry   Barb Merino MD [facility] Pager [phone]

## 2022-05-24 NOTE — ED Notes (Signed)
Floor called to check if ready for patient; not ready yet

## 2022-05-24 NOTE — ED Triage Notes (Signed)
Pt. Stated, I had some numbness that started yesterday on the right side for about 15 min and went away and then It came back around 900 last night and has stayed with me. VAN negative.

## 2022-05-24 NOTE — ED Notes (Signed)
Floor RN made aware to order a food tray before cafeteria closes

## 2022-05-24 NOTE — Progress Notes (Signed)
Carotid artery duplex study completed. Please see CV Proc for preliminary results.  Rondi Ivy BS, RVT 05/24/2022 4:59 PM

## 2022-05-24 NOTE — ED Notes (Signed)
Patient in MRI 

## 2022-05-24 NOTE — ED Provider Notes (Signed)
[facility] EMERGENCY DEPARTMENT Provider Note   CSN: [id number] Arrival date & time: 05/24/22  0848     History  Chief Complaint  Patient presents with   Numbness    Michael Knapp is a 71 y.o. male.  He has a history of hypertension diabetes hyperlipidemia.  He said he had 15 minutes of numbness in his right arm right leg yesterday.  Resolved on its own.  Starting again at 9 PM last night he again noticed numbness in his right arm right leg right chest.  Did not involve face no blurry vision or speech difficulties.  He decided to go to sleep on it and this morning his symptoms persisted, possibly having a little bit of weakness in his right hand and a little bit of difficulty with ambulation.  No recent illness.  No change in his medications.  No prior history of stroke.  The history is provided by the patient and the spouse.  Cerebrovascular Accident This is a new problem. The current episode started yesterday. The problem occurs constantly. The problem has not changed since onset.Pertinent negatives include no chest pain, no abdominal pain, no headaches and no shortness of breath. Nothing aggravates the symptoms. Nothing relieves the symptoms. He has tried rest for the symptoms. The treatment provided no relief.       Home Medications Prior to Admission medications   Medication Sig Start Date End Date Taking? Authorizing Provider  amLODipine (NORVASC) 5 MG tablet Take 5 mg by mouth daily. 04/08/22  Yes [provider]  aspirin EC 81 MG tablet Take 81 mg by mouth daily. Swallow whole.   Yes [provider]  FARXIGA 10 MG TABS tablet Take 10 mg by mouth daily. 04/30/22  Yes [provider]  glipiZIDE (GLUCOTROL) 10 MG tablet Take 10 mg by mouth 2 (two) times daily. 05/06/22  Yes [provider]  lovastatin (MEVACOR) 40 MG tablet Take 40 mg by mouth daily. 05/24/22  Yes [provider]  Vitamin D, Ergocalciferol, (DRISDOL) 1.25  MG (50000 UNIT) CAPS capsule Take 50,000 Units by mouth every Saturday. 04/30/22  Yes [provider]  losartan (COZAAR) 100 MG tablet Take 100 mg by mouth daily. 04/08/22   [provider]      Allergies    Patient has no known allergies.    Review of Systems   Review of Systems  Constitutional:  Negative for fever.  HENT:  Negative for sore throat.   Eyes:  Negative for visual disturbance.  Respiratory:  Negative for shortness of breath.   Cardiovascular:  Negative for chest pain.  Gastrointestinal:  Negative for abdominal pain.  Genitourinary:  Negative for dysuria.  Musculoskeletal:  Negative for neck pain.  Skin:  Negative for rash.  Neurological:  Positive for weakness and numbness. Negative for headaches.    Physical Exam Updated Vital Signs BP (!) 153/86 (BP Location: Right Arm)   Pulse 65   Temp 97.8 F (36.6 C) (Oral)   Resp 20   Ht 6' 1.75" (1.873 m)   Wt 93.4 kg   SpO2 99%   BMI 26.63 kg/m  Physical Exam Vitals and nursing note reviewed.  Constitutional:      General: He is not in acute distress.    Appearance: Normal appearance. He is well-developed.  HENT:     Head: Normocephalic and atraumatic.  Eyes:     Conjunctiva/sclera: Conjunctivae normal.  Cardiovascular:     Rate and Rhythm: Normal rate and  regular rhythm.     Heart sounds: No murmur heard. Pulmonary:     Effort: Pulmonary effort is normal. No respiratory distress.     Breath sounds: Normal breath sounds.  Abdominal:     Palpations: Abdomen is soft.     Tenderness: There is no abdominal tenderness.  Musculoskeletal:        General: No swelling. Normal range of motion.     Cervical back: Neck supple.     Right lower leg: No edema.     Left lower leg: No edema.  Skin:    General: Skin is warm and dry.     Capillary Refill: Capillary refill takes less than 2 seconds.  Neurological:     Mental Status: He is alert.     Cranial Nerves: No cranial nerve deficit.      Sensory: Sensory deficit present.     Motor: No weakness.     Comments: Subjective decrease sensation on right arm.  He also feels subjectively weak right hand and right leg although not appreciated on motor exam.  Psychiatric:        Mood and Affect: Mood normal.     ED Results / Procedures / Treatments   Labs (all labs ordered are listed, but only abnormal results are displayed) Labs Reviewed  COMPREHENSIVE METABOLIC PANEL - Abnormal; Notable for the following components:      Result Value   CO2 21 (*)    Glucose, Bld 187 (*)    BUN 34 (*)    Creatinine, Ser 1.83 (*)    GFR, Estimated 39 (*)    All other components within normal limits  HEMOGLOBIN A1C - Abnormal; Notable for the following components:   Hgb A1c MFr Bld 8.3 (*)    All other components within normal limits  LIPID PANEL - Abnormal; Notable for the following components:   Cholesterol 263 (*)    LDL Cholesterol 188 (*)    All other components within normal limits  CBC WITH DIFFERENTIAL/PLATELET  APTT  PROTIME-INR  URINALYSIS, ROUTINE W REFLEX MICROSCOPIC  CBG MONITORING, ED    EKG EKG Interpretation  Date/Time:  Friday May 24 2022 08:51:59 EDT Ventricular Rate:  69 PR Interval:  214 QRS Duration: 78 QT Interval:  402 QTC Calculation: 430 R Axis:   43 Text Interpretation: Sinus rhythm with sinus arrhythmia with 1st degree A-V block Otherwise normal ECG No old tracing to compare Confirmed by Meridee Score [phone]) on 05/24/2022 9:49:30 AM  Radiology VAS US CAROTID  Result Date: 05/24/2022 Carotid Arterial Duplex Study Patient Name:  Michael Knapp  Date of Exam:   05/24/2022 Medical Rec #: [id number]      Accession #:    [id number] Date of Birth: January 27, 1951      Patient Gender: M Patient Age:   34 years Exam Location:  [facility] Procedure:      VAS US CAROTID Referring Phys: Dewitt Hoes DE LA TORRE --------------------------------------------------------------------------------  Indications:  CVA. Risk  Factors: Hypertension, hyperlipidemia, Diabetes. Performing Technologist: Magdalene River  Examination Guidelines: A complete evaluation includes B-mode imaging, spectral Doppler, color Doppler, and power Doppler as needed of all accessible portions of each vessel. Bilateral testing is considered an integral part of a complete examination. Limited examinations for reoccurring indications may be performed as noted.  Right Carotid Findings: +----------+--------+--------+--------+------------------+--------+           PSV cm/sEDV cm/sStenosisPlaque DescriptionComments +----------+--------+--------+--------+------------------+--------+ CCA Prox  79      12                                         +----------+--------+--------+--------+------------------+--------+  CCA Distal93      19                                         +----------+--------+--------+--------+------------------+--------+ ICA Prox  70      12              heterogenous               +----------+--------+--------+--------+------------------+--------+ ECA       108     20                                         +----------+--------+--------+--------+------------------+--------+ +----------+--------+-------+----------------+-------------------+           PSV cm/sEDV cmsDescribe        Arm Pressure (mmHG) +----------+--------+-------+----------------+-------------------+ [id number]            Multiphasic, WNL                    +----------+--------+-------+----------------+-------------------+ +---------+--------+--+--------+--+---------+ VertebralPSV cm/s46EDV cm/s12Antegrade +---------+--------+--+--------+--+---------+  Left Carotid Findings: +----------+--------+--------+--------+------------------+--------+           PSV cm/sEDV cm/sStenosisPlaque DescriptionComments +----------+--------+--------+--------+------------------+--------+ CCA Prox  97      23                                          +----------+--------+--------+--------+------------------+--------+ CCA Distal100     24                                         +----------+--------+--------+--------+------------------+--------+ ICA Prox  62      17              heterogenous               +----------+--------+--------+--------+------------------+--------+ ICA Distal40      10                                         +----------+--------+--------+--------+------------------+--------+ ECA       98      9                                          +----------+--------+--------+--------+------------------+--------+ +----------+--------+--------+----------------+-------------------+           PSV cm/sEDV cm/sDescribe        Arm Pressure (mmHG) +----------+--------+--------+----------------+-------------------+ Subclavian                Multiphasic, WNL                    +----------+--------+--------+----------------+-------------------+ +---------+--------+--+--------+--+---------+ VertebralPSV cm/s42EDV cm/s12Antegrade +---------+--------+--+--------+--+---------+   Summary: Right Carotid: Velocities in the right ICA are consistent with a 1-39% stenosis. Left Carotid: Velocities in the left ICA are consistent with a 1-39% stenosis. Vertebrals:  Bilateral vertebral arteries demonstrate antegrade flow. Subclavians: Normal flow hemodynamics were seen in bilateral subclavian              arteries. *See table(s)  above for measurements and observations.     Preliminary    MR BRAIN WO CONTRAST  Result Date: 05/24/2022 CLINICAL DATA:  Neuro deficit, acute, stroke suspected. Right-sided numbness. EXAM: MRI HEAD WITHOUT CONTRAST MRA HEAD WITHOUT CONTRAST TECHNIQUE: Multiplanar, multi-echo pulse sequences of the brain and surrounding structures were acquired without intravenous contrast. Angiographic images of the Circle of Willis were acquired using MRA technique without intravenous contrast. COMPARISON:   Head CT 05/24/2022 FINDINGS: MRI HEAD FINDINGS Brain: There is a 7 mm acute infarct in the inferolateral aspect of the left thalamus. No intracranial hemorrhage, midline shift, or extra-axial fluid collection is identified. A small curvilinear lipoma is noted along the splenium of the corpus callosum. Small T2 hyperintensities scattered throughout the cerebral white matter bilaterally are nonspecific but compatible with mild to moderate chronic small vessel ischemic disease. There is mild cerebral atrophy. Vascular: Major intracranial vascular flow voids are preserved. Skull and upper cervical spine: Unremarkable bone marrow signal para Sinuses/Orbits: Unremarkable orbits. Clear paranasal sinuses. Trace bilateral mastoid effusions. Other: None. MRA HEAD FINDINGS Anterior circulation: The internal carotid arteries are widely patent from skull base to carotid termini. ACAs and MCAs are patent without evidence of a proximal branch occlusion or significant proximal stenosis. No aneurysm is identified. Posterior circulation: The included intracranial portions of the vertebral arteries are widely patent to the basilar. Patent PICAs are partially visualized bilaterally. SCAs are patent bilaterally. The basilar artery is widely patent. Posterior communicating arteries are diminutive or absent. Both PCAs are patent without evidence of a significant proximal stenosis. No aneurysm is identified. Anatomic variants: None. IMPRESSION: 1. Acute left thalamic infarct. 2. 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 3. Small pericallosal lipoma. 4. Negative head MRA. Electronically Signed   By: Sebastian Ache M.D.   On: 05/24/2022 16:47   MR ANGIO HEAD WO CONTRAST  Result Date: 05/24/2022 CLINICAL DATA:  Neuro deficit, acute, stroke suspected. Right-sided numbness. EXAM: MRI HEAD WITHOUT CONTRAST MRA HEAD WITHOUT CONTRAST TECHNIQUE: Multiplanar, multi-echo pulse sequences of the brain and surrounding structures were  acquired without intravenous contrast. Angiographic images of the Circle of Willis were acquired using MRA technique without intravenous contrast. COMPARISON:  Head CT 05/24/2022 FINDINGS: MRI HEAD FINDINGS Brain: There is a 7 mm acute infarct in the inferolateral aspect of the left thalamus. No intracranial hemorrhage, midline shift, or extra-axial fluid collection is identified. A small curvilinear lipoma is noted along the splenium of the corpus callosum. Small T2 hyperintensities scattered throughout the cerebral white matter bilaterally are nonspecific but compatible with mild to moderate chronic small vessel ischemic disease. There is mild cerebral atrophy. Vascular: Major intracranial vascular flow voids are preserved. Skull and upper cervical spine: Unremarkable bone marrow signal para Sinuses/Orbits: Unremarkable orbits. Clear paranasal sinuses. Trace bilateral mastoid effusions. Other: None. MRA HEAD FINDINGS Anterior circulation: The internal carotid arteries are widely patent from skull base to carotid termini. ACAs and MCAs are patent without evidence of a proximal branch occlusion or significant proximal stenosis. No aneurysm is identified. Posterior circulation: The included intracranial portions of the vertebral arteries are widely patent to the basilar. Patent PICAs are partially visualized bilaterally. SCAs are patent bilaterally. The basilar artery is widely patent. Posterior communicating arteries are diminutive or absent. Both PCAs are patent without evidence of a significant proximal stenosis. No aneurysm is identified. Anatomic variants: None. IMPRESSION: 1. Acute left thalamic infarct. 2. 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 3. Small pericallosal lipoma. 4. Negative head MRA. Electronically Signed   By: Freida Busman  Mosetta PuttGrady M.D.   On: 05/24/2022 16:47   CT Head Wo Contrast  Result Date: 05/24/2022 CLINICAL DATA:  TIA, right leg numbness EXAM: CT HEAD WITHOUT CONTRAST TECHNIQUE:  Contiguous axial images were obtained from the base of the skull through the vertex without intravenous contrast. RADIATION DOSE REDUCTION: This exam was performed according to the departmental dose-optimization program which includes automated exposure control, adjustment of the mA and/or kV according to patient size and/or use of iterative reconstruction technique. COMPARISON:  None Available. FINDINGS: Brain: No evidence of acute infarction, hemorrhage, hydrocephalus, extra-axial collection or mass lesion/mass effect. Vascular: No hyperdense vessel or unexpected calcification. Skull: Normal. Negative for fracture or focal lesion. Sinuses/Orbits: No acute finding. Other: None. IMPRESSION: No acute intracranial pathology. No non-contrast CT findings to explain right-sided numbness. Consider MRI to more sensitively evaluate for acute diffusion restricting infarction. Electronically Signed   By: Jearld LeschAlex D Bibbey M.D.   On: 05/24/2022 10:22    Procedures Procedures    Medications Ordered in ED Medications  aspirin chewable tablet 81 mg (has no administration in time range)  clopidogrel (PLAVIX) tablet 75 mg (75 mg Oral Given 05/24/22 1810)  insulin aspart (novoLOG) injection 0-9 Units ( Subcutaneous Not Given 05/24/22 1727)  insulin aspart (novoLOG) injection 0-5 Units (has no administration in time range)  enoxaparin (LOVENOX) injection 30 mg (has no administration in time range)    ED Course/ Medical Decision Making/ A&P Clinical Course as of 05/24/22 1828  Fri May 24, 2022  1247 Last creatinine done a couple of days ago was 1.72. [MB]    Clinical Course User Index [MB] Terrilee FilesButler, Santita Hunsberger C, MD                           Medical Decision Making Amount and/or Complexity of Data Reviewed Radiology: ordered.  Risk Decision regarding hospitalization.  This patient complains of right arm and leg numbness possible weakness; this involves an extensive number of treatment Options and is a complaint that  carries with it a high risk of complications and morbidity. The differential includes TIA, stroke, bleed, tumor, seizure, peripheral nerve, metabolic derangement  I ordered, reviewed and interpreted labs, which included CBC with normal white count normal hemoglobin, chemistries with mildly low bicarb elevated creatinine, normal coags I ordered imaging studies which included CT head, MRI brain and I independently    visualized and interpreted imaging which showed no acute infarct on head CT.  MRI pending at the time of signout Additional history obtained from patient's wife Previous records obtained and reviewed in epic including recent PCP and endocrinology notes I consulted Dr. Amada JupiterKirkpatrick neurology and discussed lab and imaging findings and discussed disposition.  Cardiac monitoring reviewed, normal sinus rhythm Social determinants considered, no significant barriers Critical Interventions: None  After the interventions stated above, I reevaluated the patient and found patient still to have subjective decrease sensation on right side Admission and further testing considered, his care signed out to Dr. Rodena MedinMessick to follow-up on results of MRI and final neurology recommendations.  If positive will likely need admission for further work-up.          Final Clinical Impression(s) / ED Diagnoses Final diagnoses:  Right sided numbness  Acute CVA (cerebrovascular accident) [place](HCC)    Rx / DC Orders ED Discharge Orders     None         Terrilee FilesButler, Elena Cothern C, MD 05/24/22 1831

## 2022-05-24 NOTE — Consult Note (Addendum)
Stroke Neurology Consultation Note  Consult Requested by: Dr. Charm Barges  Reason for Consult: right sided numbness  Consult Date:  05/24/22  The history was obtained from the patient and chart.  During history and examination, all items were able to obtain unless otherwise noted.  History of Present Illness:  Michael Knapp is an 71 y.o. Caucasian male with PMH of DM2, CKD3b, HTN, HLD and OSA who presents with left sided numbness.  Patient states that around 1400 yesterday, he had an episode of numbness to the right side of his body which resolved spontaneously.  Then, around 2100 last night, the numbness returned, along with some tingling in the left arm.  The numbness is gradually improving.  Patient denies difficulty speaking, vision problems, headache or unilateral weakness.  He states that he has never had a stroke or TIA and that he has had only one migraine in the remote past.  Date last known well: Date: 05/23/2022 Time last known well: Time: 21:00 tPA Given: No: out of window MRS:  0 NIHSS:  1 for sensory loss  Past Medical History:  Diagnosis Date   Diabetes mellitus without complication (HCC)    High cholesterol    Hypertension      History reviewed. No pertinent surgical history.  History reviewed. No pertinent family history.   Social History:  reports that he has quit smoking. His smoking use included cigarettes. He has never used smokeless tobacco. He reports that he does not currently use alcohol. He reports that he does not currently use drugs.  Review of Systems: A full ROS was attempted today and was  able to be performed.  Systems assessed include - Constitutional, Eyes, HENT, Respiratory, Cardiovascular, Gastrointestinal, Genitourinary, Integument/breast, Hematologic/lymphatic, Musculoskeletal, Neurological, Behavioral/Psych, Endocrine, Allergic/Immunologic - with pertinent responses as per HPI.  Allergies: No Known Allergies   Medications: Prior to Admission: (Not  in a hospital admission)   Test Results: CBC:  Recent Labs  Lab 05/24/22 0922  WBC 6.2  NEUTROABS 4.1  HGB 15.3  HCT 47.4  MCV 91.7  PLT 248   Basic Metabolic Panel:  Recent Labs  Lab 05/24/22 0922  NA 138  K 4.4  CL 105  CO2 21*  GLUCOSE 187*  BUN 34*  CREATININE 1.83*  CALCIUM 9.8   Liver Function Tests: Recent Labs  Lab 05/24/22 0922  AST 20  ALT 20  ALKPHOS 80  BILITOT 0.5  PROT 7.2  ALBUMIN 4.2   No results for input(s): "LIPASE", "AMYLASE" in the last 168 hours. No results for input(s): "AMMONIA" in the last 168 hours. Coagulation Studies:  Recent Labs    05/24/22 0922  LABPROT 12.5  INR 0.9   Cardiac Enzymes: No results for input(s): "CKTOTAL", "CKMB", "CKMBINDEX", "TROPONINI" in the last 168 hours. BNP: Invalid input(s): "POCBNP" CBG: No results for input(s): "GLUCAP" in the last 168 hours. Urinalysis: No results for input(s): "COLORURINE", "LABSPEC", "PHURINE", "GLUCOSEU", "HGBUR", "BILIRUBINUR", "KETONESUR", "PROTEINUR", "UROBILINOGEN", "NITRITE", "LEUKOCYTESUR" in the last 168 hours.  Invalid input(s): "APPERANCEUR" Microbiology: No results found for this or any previous visit. Lipid Panel: No results found for: "CHOL", "TRIG", "HDL", "CHOLHDL", "VLDL", "LDLCALC" HgbA1c: No results found for: "HGBA1C" Urine Drug Screen: No results found for: "LABOPIA", "COCAINSCRNUR", "LABBENZ", "AMPHETMU", "THCU", "LABBARB"  Alcohol Level: No results for input(s): "ETH" in the last 168 hours.  CT Head Wo Contrast  Result Date: 05/24/2022 CLINICAL DATA:  TIA, right leg numbness EXAM: CT HEAD WITHOUT CONTRAST TECHNIQUE: Contiguous axial images were obtained from the  base of the skull through the vertex without intravenous contrast. RADIATION DOSE REDUCTION: This exam was performed according to the departmental dose-optimization program which includes automated exposure control, adjustment of the mA and/or kV according to patient size and/or use of iterative  reconstruction technique. COMPARISON:  None Available. FINDINGS: Brain: No evidence of acute infarction, hemorrhage, hydrocephalus, extra-axial collection or mass lesion/mass effect. Vascular: No hyperdense vessel or unexpected calcification. Skull: Normal. Negative for fracture or focal lesion. Sinuses/Orbits: No acute finding. Other: None. IMPRESSION: No acute intracranial pathology. No non-contrast CT findings to explain right-sided numbness. Consider MRI to more sensitively evaluate for acute diffusion restricting infarction. Electronically Signed   By: Jearld Lesch M.D.   On: 05/24/2022 10:22     EKG: normal EKG, normal sinus rhythm, there are no previous tracings available for comparison.   Physical Examination: Temp:  [97.8 F (36.6 C)] 97.8 F (36.6 C) (06/09 0853) Pulse Rate:  [64-73] 67 (06/09 1430) Resp:  [13-20] 13 (06/09 1430) BP: (153-176)/(81-93) 169/86 (06/09 1430) SpO2:  [99 %-100 %] 100 % (06/09 1430) Weight:  [93.4 kg] 93.4 kg (06/09 0907)  General - Well nourished, well developed, in no apparent distress.   Cardiovascular - Regular rate and rhythm.  Mental Status -  Level of arousal and orientation to time, place, and person were intact. Language including expression, repetition, comprehension was assessed and found intact. Attention span and concentration were normal. Recent and remote memory were intact.   Cranial Nerves II - XII - II - Visual field intact OU. III, IV, VI - Extraocular movements intact. V - Facial sensation intact bilaterally. VII - Facial movement intact bilaterally. VIII - Hearing & vestibular intact bilaterally. X - Phonation is normal. XI - Chin turning & shoulder shrug intact bilaterally. XII - Tongue protrusion intact.  Motor Strength - The patient's strength was normal in all extremities and pronator drift was absent.  Bulk was normal and fasciculations were absent.   Motor Tone - Muscle tone was assessed at the neck and appendages  and was normal.  Reflexes - The patient's reflexes were symmetrical in all extremities and he had no pathological reflexes.  Sensory - Light touch was assessed and was noted to be diminished in the right lower extremity.  Tingling was present in the right upper extremity  Coordination - The patient had slightly slow FNF bilaterally. Tremor was absent.  Gait and Station - deferred.   Assessment:  Michael Knapp is a 71 y.o. male with history of DM, CKD, HTN, HLD and OSA presenting with right sided numbness. He did not receive IV t-PA due to being outside the window and having very mild symptoms. Symptoms are mild and have improved since last night.  Presentation is most consistent with a small stroke.  Will await results of MRI, CTA, echocardiogram..  Stroke:  likely left sided infarct secondary to small vessel disease source CT head No acute abnormality CTA head & neck pending MRI  pending MRA pending 2D Echo pending LDL No results found for requested labs within last 1095 days. HgbA1c No results found for requested labs within last 1095 days.  SCDsfor VTE prophylaxis Diet Order     None       aspirin 81 mg daily prior to admission, now on aspirin 81 mg daily and clopidogrel 75 mg daily.  Therapy recommendations:  pending Disposition:  pending,   Hypertension Stable Permissive hypertension (OK if < 220/120) but gradually normalize in 5-7 days Long-term BP goal  normotensive  Hyperlipidemia Home meds:  lovastatin 40 mg daily, LDL No results found for requested labs within last 1095 days., goal < 70 Change to high intensity statin if LDL above goal Continue statin at discharge  Diabetes type II, Uncontrolled HgbA1c No results found for requested labs within last 1095 days., goal < 7.0 Home meds Farxiga 10 mg daily, glipzide 10 mg daily, metformin 500 mg BID CBGs SSI  Other Stroke Risk Factors Advanced age Obstructive sleep apnea, on CPAP at home   Other Active  Problems none  Hospital day # 0   Thank you for this consultation and allowing us to participate in the care of this patient.  Cortney E Ernestina Columbiae La Torre , MSN, AGACNP-BC [facility] See Amion for schedule and pager information 05/24/2022 3:13 PM   I have seen the patient and reviewed the above note and made appropriate edits.  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  He is being admitted for secondary risk factor modification and therapy evaluations.  Ritta SlotMcNeill Tel Hevia, MD [facility] [phone]  If 7pm- 7am, please page neurology on call as listed in AMION.

## 2022-05-24 NOTE — ED Provider Triage Note (Signed)
Emergency Medicine Provider Triage Evaluation Note  Michael Knapp , a 71 y.o. male  was evaluated in triage.  Pt complains of right sided weakness since 1400 yesterday that lasted around 15 minutes. He reports that around 2100 he had persistent right upper and lower extremity weakness with right shoulder pain. Denies any headache, blurry vision, or facial involvement.  Review of Systems  Positive:  Negative:   Physical Exam  BP (!) 176/81 (BP Location: Right Arm)   Pulse 73   Temp 97.8 F (36.6 C) (Oral)   Resp 16   Ht 6' 1.75" (1.873 m)   Wt 93.4 kg   SpO2 100%   BMI 26.63 kg/m  Gen:   Awake, no distress   Resp:  Normal effort  MSK:   Moves extremities without difficulty  Other:  Cranial nerves II through XII intact.  No pronator drift.  Equal strength in upper and lower bilateral extremities.  Sensation intact throughout.  Medical Decision Making  Medically screening exam initiated at 9:14 AM.  Appropriate orders placed.  Doreatha Martin was informed that the remainder of the evaluation will be completed by another provider, this initial triage assessment does not replace that evaluation, and the importance of remaining in the ED until their evaluation is complete.  Out of window for stroke.  Given benign neurological exam, could be MSK.  Will order CT and basic labs start off with.   Sherrell Puller, [place] 05/24/22 [phone]

## 2022-05-25 ENCOUNTER — Observation Stay (HOSPITAL_BASED_OUTPATIENT_CLINIC_OR_DEPARTMENT_OTHER): Payer: Medicare Other

## 2022-05-25 DIAGNOSIS — G459 Transient cerebral ischemic attack, unspecified: Secondary | ICD-10-CM | POA: Diagnosis not present

## 2022-05-25 DIAGNOSIS — I6389 Other cerebral infarction: Secondary | ICD-10-CM

## 2022-05-25 LAB — ECHOCARDIOGRAM COMPLETE
AR max vel: 2.09 cm2
AV Area VTI: 1.84 cm2
AV Area mean vel: 1.91 cm2
AV Mean grad: 2 mmHg
AV Peak grad: 3.5 mmHg
Ao pk vel: 0.94 m/s
Area-P 1/2: 3.4 cm2
Height: 73.75 in
S' Lateral: 3 cm
Weight: 3301.61 oz

## 2022-05-25 LAB — GLUCOSE, CAPILLARY
Glucose-Capillary: 165 mg/dL — ABNORMAL HIGH (ref 70–99)
Glucose-Capillary: 245 mg/dL — ABNORMAL HIGH (ref 70–99)
Glucose-Capillary: 245 mg/dL — ABNORMAL HIGH (ref 70–99)

## 2022-05-25 MED ORDER — GLIPIZIDE 5 MG PO TABS: 10.0000 mg | ORAL_TABLET | Freq: Two times a day (BID) | ORAL | Status: DC

## 2022-05-25 MED ORDER — ASPIRIN 81 MG PO TBEC: 81.0000 mg | DELAYED_RELEASE_TABLET | Freq: Every day | ORAL | 0 refills | Status: AC

## 2022-05-25 MED ORDER — DAPAGLIFLOZIN PROPANEDIOL 10 MG PO TABS
10.0000 mg | ORAL_TABLET | Freq: Every day | ORAL | Status: DC
  Administered 2022-05-25: 10 mg via ORAL
  Filled 2022-05-25: qty 1

## 2022-05-25 MED ORDER — ATORVASTATIN CALCIUM 80 MG PO TABS: 80.0000 mg | ORAL_TABLET | Freq: Every day | ORAL | 2 refills | Status: AC

## 2022-05-25 MED ORDER — ENOXAPARIN SODIUM 40 MG/0.4ML IJ SOSY: 40.0000 mg | PREFILLED_SYRINGE | INTRAMUSCULAR | Status: DC

## 2022-05-25 MED ORDER — STROKE: EARLY STAGES OF RECOVERY BOOK: Freq: Once | Status: AC

## 2022-05-25 MED ORDER — LOSARTAN POTASSIUM 50 MG PO TABS
100.0000 mg | ORAL_TABLET | Freq: Every day | ORAL | Status: DC
  Administered 2022-05-25: 100 mg via ORAL
  Filled 2022-05-25: qty 2

## 2022-05-25 MED ORDER — PRAVASTATIN SODIUM 40 MG PO TABS: 40.0000 mg | ORAL_TABLET | Freq: Every day | ORAL | Status: DC

## 2022-05-25 MED ORDER — AMLODIPINE BESYLATE 5 MG PO TABS
5.0000 mg | ORAL_TABLET | Freq: Every day | ORAL | Status: DC
  Administered 2022-05-25: 5 mg via ORAL
  Filled 2022-05-25: qty 1

## 2022-05-25 MED ORDER — ATORVASTATIN CALCIUM 80 MG PO TABS
80.0000 mg | ORAL_TABLET | Freq: Every day | ORAL | Status: DC
  Administered 2022-05-25: 80 mg via ORAL
  Filled 2022-05-25: qty 1

## 2022-05-25 MED ORDER — CLOPIDOGREL BISULFATE 75 MG PO TABS: 75.0000 mg | ORAL_TABLET | Freq: Every day | ORAL | 2 refills | Status: AC

## 2022-05-25 NOTE — Plan of Care (Signed)
  Problem: Health Behavior/Discharge Planning: Goal: Ability to identify and utilize available resources and services will improve Outcome: Adequate for Discharge

## 2022-05-25 NOTE — Progress Notes (Addendum)
STROKE TEAM PROGRESS NOTE   INTERVAL HISTORY Patient is seen in his room with no family at the bedside.  On 6/8, he experienced sudden onset right sided numbness around 1400.  This resolved spontaneously, but symptoms returned around 2100 and did not resolve.  MRI shows small left thalamic stroke.  Patient states that his numbness has gradually improved and that it has since resolved.  MR angiogram of the brain shows no large vessel stenosis or occlusion.  Carotid ultrasound shows no significant extracranial stenosis.  2D echo is pending.  LDL cholesterol is elevated 188 and hemoglobin A1c at 8.3  Vitals:   05/24/22 2332 05/25/22 0412 05/25/22 0529 05/25/22 0800  BP: (!) 147/90  137/87 (!) 142/76  Pulse: 85  73 93  Resp: 20  18 18   Temp: 97.8 F (36.6 C)  97.8 F (36.6 C) 98.1 F (36.7 C)  TempSrc: Oral  Oral   SpO2: 97%  98% 100%  Weight:  93.6 kg    Height:       CBC:  Recent Labs  Lab 05/24/22 0922  WBC 6.2  NEUTROABS 4.1  HGB 15.3  HCT 47.4  MCV 91.7  PLT 248   Basic Metabolic Panel:  Recent Labs  Lab 05/24/22 0922  NA 138  K 4.4  CL 105  CO2 21*  GLUCOSE 187*  BUN 34*  CREATININE 1.83*  CALCIUM 9.8   Lipid Panel:  Recent Labs  Lab 05/24/22 1723  CHOL 263*  TRIG 133  HDL 48  CHOLHDL 5.5  VLDL 27  LDLCALC 07/24/22*   HgbA1c:  Recent Labs  Lab 05/24/22 1723  HGBA1C 8.3*   Urine Drug Screen: No results for input(s): "LABOPIA", "COCAINSCRNUR", "LABBENZ", "AMPHETMU", "THCU", "LABBARB" in the last 168 hours.  Alcohol Level No results for input(s): "ETH" in the last 168 hours.  IMAGING past 24 hours VAS 07/24/22 CAROTID  Result Date: 05/24/2022 Carotid Arterial Duplex Study Patient Name:  Michael Knapp  Date of Exam:   05/24/2022 Medical Rec #: 07/[id number]      Accession #:    [id number] Date of Birth: Apr 02, 1951      Patient Gender: M Patient Age:   11 years Exam Location:  [facility] Procedure:      VAS [facility] CAROTID Referring Phys: [place] DE LA TORRE  --------------------------------------------------------------------------------  Indications:  CVA. Risk Factors: Hypertension, hyperlipidemia, Diabetes. Performing Technologist: Dewitt Hoes  Examination Guidelines: A complete evaluation includes B-mode imaging, spectral Doppler, color Doppler, and power Doppler as needed of all accessible portions of each vessel. Bilateral testing is considered an integral part of a complete examination. Limited examinations for reoccurring indications may be performed as noted.  Right Carotid Findings: +----------+--------+--------+--------+------------------+--------+           PSV cm/sEDV cm/sStenosisPlaque DescriptionComments +----------+--------+--------+--------+------------------+--------+ CCA Prox  79      12                                         +----------+--------+--------+--------+------------------+--------+ CCA Distal93      19                                         +----------+--------+--------+--------+------------------+--------+ ICA Prox  70      12  heterogenous               +----------+--------+--------+--------+------------------+--------+ ECA       108     20                                         +----------+--------+--------+--------+------------------+--------+ +----------+--------+-------+----------------+-------------------+           PSV cm/sEDV cmsDescribe        Arm Pressure (mmHG) +----------+--------+-------+----------------+-------------------+ [id number]            Multiphasic, WNL                    +----------+--------+-------+----------------+-------------------+ +---------+--------+--+--------+--+---------+ VertebralPSV cm/s46EDV cm/s12Antegrade +---------+--------+--+--------+--+---------+  Left Carotid Findings: +----------+--------+--------+--------+------------------+--------+           PSV cm/sEDV cm/sStenosisPlaque DescriptionComments  +----------+--------+--------+--------+------------------+--------+ CCA Prox  97      23                                         +----------+--------+--------+--------+------------------+--------+ CCA Distal100     24                                         +----------+--------+--------+--------+------------------+--------+ ICA Prox  62      17              heterogenous               +----------+--------+--------+--------+------------------+--------+ ICA Distal40      10                                         +----------+--------+--------+--------+------------------+--------+ ECA       98      9                                          +----------+--------+--------+--------+------------------+--------+ +----------+--------+--------+----------------+-------------------+           PSV cm/sEDV cm/sDescribe        Arm Pressure (mmHG) +----------+--------+--------+----------------+-------------------+ Subclavian                Multiphasic, WNL                    +----------+--------+--------+----------------+-------------------+ +---------+--------+--+--------+--+---------+ VertebralPSV cm/s42EDV cm/s12Antegrade +---------+--------+--+--------+--+---------+   Summary: Right Carotid: Velocities in the right ICA are consistent with a 1-39% stenosis. Left Carotid: Velocities in the left ICA are consistent with a 1-39% stenosis. Vertebrals:  Bilateral vertebral arteries demonstrate antegrade flow. Subclavians: Normal flow hemodynamics were seen in bilateral subclavian              arteries. *See table(s) above for measurements and observations.     Preliminary    MR BRAIN WO CONTRAST  Result Date: 05/24/2022 CLINICAL DATA:  Neuro deficit, acute, stroke suspected. Right-sided numbness. EXAM: MRI HEAD WITHOUT CONTRAST MRA HEAD WITHOUT CONTRAST TECHNIQUE: Multiplanar, multi-echo pulse sequences of the brain and surrounding structures were acquired without intravenous  contrast. Angiographic images of the Circle of Willis were acquired using MRA technique without intravenous contrast.  COMPARISON:  Head CT 05/24/2022 FINDINGS: MRI HEAD FINDINGS Brain: There is a 7 mm acute infarct in the inferolateral aspect of the left thalamus. No intracranial hemorrhage, midline shift, or extra-axial fluid collection is identified. A small curvilinear lipoma is noted along the splenium of the corpus callosum. Small T2 hyperintensities scattered throughout the cerebral white matter bilaterally are nonspecific but compatible with mild to moderate chronic small vessel ischemic disease. There is mild cerebral atrophy. Vascular: Major intracranial vascular flow voids are preserved. Skull and upper cervical spine: Unremarkable bone marrow signal para Sinuses/Orbits: Unremarkable orbits. Clear paranasal sinuses. Trace bilateral mastoid effusions. Other: None. MRA HEAD FINDINGS Anterior circulation: The internal carotid arteries are widely patent from skull base to carotid termini. ACAs and MCAs are patent without evidence of a proximal branch occlusion or significant proximal stenosis. No aneurysm is identified. Posterior circulation: The included intracranial portions of the vertebral arteries are widely patent to the basilar. Patent PICAs are partially visualized bilaterally. SCAs are patent bilaterally. The basilar artery is widely patent. Posterior communicating arteries are diminutive or absent. Both PCAs are patent without evidence of a significant proximal stenosis. No aneurysm is identified. Anatomic variants: None. IMPRESSION: 1. Acute left thalamic infarct. 2. 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 3. Small pericallosal lipoma. 4. Negative head MRA. Electronically Signed   By: Sebastian Ache M.D.   On: 05/24/2022 16:47   MR ANGIO HEAD WO CONTRAST  Result Date: 05/24/2022 CLINICAL DATA:  Neuro deficit, acute, stroke suspected. Right-sided numbness. EXAM: MRI HEAD WITHOUT  CONTRAST MRA HEAD WITHOUT CONTRAST TECHNIQUE: Multiplanar, multi-echo pulse sequences of the brain and surrounding structures were acquired without intravenous contrast. Angiographic images of the Circle of Willis were acquired using MRA technique without intravenous contrast. COMPARISON:  Head CT 05/24/2022 FINDINGS: MRI HEAD FINDINGS Brain: There is a 7 mm acute infarct in the inferolateral aspect of the left thalamus. No intracranial hemorrhage, midline shift, or extra-axial fluid collection is identified. A small curvilinear lipoma is noted along the splenium of the corpus callosum. Small T2 hyperintensities scattered throughout the cerebral white matter bilaterally are nonspecific but compatible with mild to moderate chronic small vessel ischemic disease. There is mild cerebral atrophy. Vascular: Major intracranial vascular flow voids are preserved. Skull and upper cervical spine: Unremarkable bone marrow signal para Sinuses/Orbits: Unremarkable orbits. Clear paranasal sinuses. Trace bilateral mastoid effusions. Other: None. MRA HEAD FINDINGS Anterior circulation: The internal carotid arteries are widely patent from skull base to carotid termini. ACAs and MCAs are patent without evidence of a proximal branch occlusion or significant proximal stenosis. No aneurysm is identified. Posterior circulation: The included intracranial portions of the vertebral arteries are widely patent to the basilar. Patent PICAs are partially visualized bilaterally. SCAs are patent bilaterally. The basilar artery is widely patent. Posterior communicating arteries are diminutive or absent. Both PCAs are patent without evidence of a significant proximal stenosis. No aneurysm is identified. Anatomic variants: None. IMPRESSION: 1. Acute left thalamic infarct. 2. 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 3. Small pericallosal lipoma. 4. Negative head MRA. Electronically Signed   By: Sebastian Ache M.D.   On: 05/24/2022 16:47     PHYSICAL EXAM General:  Alert, well-developed, well-nourished elderly patient in no acute distress Respiratory:  Regular, unlabored respirations on room air  NEURO:  Mental Status: AA&Ox3  Speech/Language: speech is without dysarthria or aphasia.  Fluency, and comprehension intact.  Cranial Nerves:  II: PERRL. Visual fields full.  III, IV, VI: EOMI. Eyelids elevate symmetrically.  V: Sensation  is intact to light touch and symmetrical to face.  VII: Smile is symmetrical.  VIII: hearing intact to voice. IX, X: Phonation is normal.  XII: tongue is midline without fasciculations. Motor: 5/5 strength to all muscle groups tested.  Sensation- Intact to light touch bilaterally.   Coordination: FTN intact bilaterally Gait- deferred   ASSESSMENT/PLAN Michael Knapp is a 71 y.o. male with history of DM2, CKD3b, HTN, HLD and OSA presenting with sudden onset right sided numbness which occurred around 1400 on 6/8.   This resolved spontaneously, but symptoms returned around 2100 and did not resolve.  MRI shows small left thalamic stroke.  Patient states that his numbness has gradually improved and that it has since resolved.  Stroke:  left thalamic infarct likely secondary due to thrombosis in setting of uncontrolled risk factors and small vessel disease CT head No acute abnormality.  MRI  Acute left thalamic infarct, mild to moderate chronic small vessel ischemic disease, small pericallosal lipoma MRA  No LVO or significant stenosis Carotid Doppler  1-39% stenosis in bilateral carotids 2D Echo pending LDL 188 HgbA1c 8.3 VTE prophylaxis - lovenox    Diet   Diet Carb Modified Fluid consistency: Thin; Room service appropriate? Yes   aspirin 81 mg daily prior to admission, now on aspirin 81 mg daily and clopidogrel 75 mg daily for three weeks then clopidogrel indefinitely Therapy recommendations:  pending Disposition:  pending, likely home  Hypertension Home meds:  amlodipine 5 mg  daily, losartan 100 mg daily Stable Permissive hypertension (OK if < 220/120) but gradually normalize in 5-7 days Long-term BP goal normotensive  Hyperlipidemia Home meds:  lovastatin 40 mg daily, changed to atorvastatin 80 mg daily LDL 188, goal < 70 Continue statin at discharge  Diabetes type II Uncontrolled Home meds:  Farxiga 10 mg daily, glipizide 10 mg daily, metformin 1000 mg BID HgbA1c 8.3, goal < 7.0 CBGs Recent Labs    05/24/22 1725 05/24/22 2151 05/25/22 0635  GLUCAP 90 261* 165*    SSI Recommend close PCP follow up for better diabetes control  Other Stroke Risk Factors Advanced Age >/= 6365  Former Cigarette smoker Obstructive sleep apnea, on CPAP at home  Other Active Problems none  Hospital day # 0  Cortney E Ernestina Columbiae La Torre , MSN, AGACNP-BC [facility] See Amion for schedule and pager information 05/25/2022 12:25 PM   STROKE MD NOTE :  I have personally obtained history,examined this patient, reviewed notes, independently viewed imaging studies, participated in medical decision making and plan of care.ROS completed by me personally and pertinent positives fully documented  I have made any additions or clarifications directly to the above note. Agree with note above.  He presented with fluctuating right body paresthesias due to left thalamic lacunar infarct from small vessel disease.  Recommend aspirin and Plavix for 3 weeks followed by Plavix alone.  Aggressive risk factor modification.  Check echocardiogram results.  Patient will be discharged home and can follow-up in the stroke clinic in 2 months.  Greater than 50% time during this 50-minute visit was spent in counseling and coordination of care about lacunar stroke and discussion about stroke prevention and treatment and answering questions.  Discussed with Dr.Kuber Lennox LaityGhimire  Khamani Daniely, MD Medical Director [facility] Pager: [phone] 05/25/2022 2:22 PM   To contact Stroke  Continuity provider, please refer to [email or web address]. After hours, contact General Neurology

## 2022-05-25 NOTE — Progress Notes (Signed)
  Echocardiogram 2D Echocardiogram has been performed.  Michael Knapp 05/25/2022, 3:55 PM

## 2022-05-25 NOTE — Evaluation (Signed)
Physical Therapy Evaluation Patient Details Name: Michael Knapp MRN: [id number] DOB: 02/16/1951 Today's Date: 05/25/2022  History of Present Illness  The pt is a 71 yo male presenting 6/9 with R sided numbness that started on 6/8. MRI revealed small L thalamic stroke. PMH includes: HTN, HLD, and DM II.   Clinical Impression  Pt in bed upon arrival of PT, agreeable to evaluation at this time. Prior to admission the pt was independent with all mobility, working 4 days/week at a garden center and generally active. The pt now presents with minor limitations in dynamic stability, but was able to manage hallway ambulation without overt LOB (few staggering steps pt was able to self-correct), and completed balance challenges such as head turns, changes in directions, and changes in speed without issue. The pt was educated in BE FAST and progressive exercises and walking progression to return to full activity. Pt expressed understanding, no further acute PT needs at this time.    Gait Speed: 0.71m/s (Gait speed < 1.11m/s indicates increased risk of falls)  Dynamic Gait Index (DGI): 23/24 (<19 indicates increased risk for falls)    Recommendations for follow up therapy are one component of a multi-disciplinary discharge planning process, led by the attending physician.  Recommendations may be updated based on patient status, additional functional criteria and insurance authorization.  Follow Up Recommendations No PT follow up    Assistance Recommended at Discharge PRN  Patient can return home with the following  Assist for transportation;Help with stairs or ramp for entrance    Equipment Recommendations None recommended by PT  Recommendations for Other Services       Functional Status Assessment Patient has had a recent decline in their functional status and demonstrates the ability to make significant improvements in function in a reasonable and predictable amount of time.     Precautions /  Restrictions Precautions Precautions: None Restrictions Weight Bearing Restrictions: No      Mobility  Bed Mobility Overal bed mobility: Independent                  Transfers Overall transfer level: Independent Equipment used: None                    Ambulation/Gait Ambulation/Gait assistance: Supervision Gait Distance (Feet): 300 Feet Assistive device: None Gait Pattern/deviations: Step-through pattern, Decreased stride length, Drifts right/left Gait velocity: 0.83 m/s Gait velocity interpretation: >2.62 ft/sec, indicative of community ambulatory   General Gait Details: pt at times drifting to R/L or taking single staggering step. able to correct without assist or LOB.  Stairs Stairs: Yes Stairs assistance: Supervision Stair Management: One rail Right, Alternating pattern, Forwards Number of Stairs: 4 General stair comments: completed with and without rail, more stable with rail  Wheelchair Mobility    Modified Rankin (Stroke Patients Only) Modified Rankin (Stroke Patients Only) Pre-Morbid Rankin Score: No symptoms Modified Rankin: Moderate disability     Balance Overall balance assessment: Mild deficits observed, not formally tested                               Standardized Balance Assessment Standardized Balance Assessment : Dynamic Gait Index   Dynamic Gait Index Level Surface: Normal Change in Gait Speed: Normal Gait with Horizontal Head Turns: Normal Gait with Vertical Head Turns: Normal Gait and Pivot Turn: Normal Step Over Obstacle: Normal Step Around Obstacles: Normal Steps: Mild Impairment Total Score: 23  Pertinent Vitals/Pain Pain Assessment Pain Assessment: No/denies pain    Home Living Family/patient expects to be discharged to:: Private residence Living Arrangements: Spouse/significant other Available Help at Discharge: Family;Available 24 hours/day Type of Home: House Home Access: Level entry        Home Layout: One level Home Equipment: Shower seat      Prior Function Prior Level of Function : Independent/Modified Independent;Working/employed;Driving             Mobility Comments: pt works 4 days/wk at garden center at [employer]. active, on his feet all day ADLs Comments: pt reports independence     Hand Dominance        Extremity/Trunk Assessment   Upper Extremity Assessment Upper Extremity Assessment: Overall WFL for tasks assessed    Lower Extremity Assessment Lower Extremity Assessment: Overall WFL for tasks assessed    Cervical / Trunk Assessment Cervical / Trunk Assessment: Normal  Communication   Communication: No difficulties  Cognition Arousal/Alertness: Awake/alert Behavior During Therapy: WFL for tasks assessed/performed Overall Cognitive Status: Within Functional Limits for tasks assessed                                          General Comments General comments (skin integrity, edema, etc.): VSS, pt educated on BE FAST        Assessment/Plan    PT Assessment Patient does not need any further PT services  PT Problem List Decreased strength;Impaired sensation       PT Treatment Interventions      PT Goals (Current goals can be found in the Care Plan section)  Acute Rehab PT Goals Patient Stated Goal: return home today PT Goal Formulation: With patient Time For Goal Achievement: 06/08/22 Potential to Achieve Goals: Good     AM-PAC PT "6 Clicks" Mobility  Outcome Measure Help needed turning from your back to your side while in a flat bed without using bedrails?: None Help needed moving from lying on your back to sitting on the side of a flat bed without using bedrails?: None Help needed moving to and from a bed to a chair (including a wheelchair)?: None Help needed standing up from a chair using your arms (e.g., wheelchair or bedside chair)?: None Help needed to walk in hospital room?: None Help needed climbing  3-5 steps with a railing? : A Little 6 Click Score: 23    End of Session   Activity Tolerance: Patient tolerated treatment well Patient left:  (standing in room) Nurse Communication: Mobility status PT Visit Diagnosis: Other abnormalities of gait and mobility (R26.89)    Time: 0258-5277 PT Time Calculation (min) (ACUTE ONLY): 11 min   Charges:   PT Evaluation $PT Eval Low Complexity: 1 Low          Vickki Muff, PT, DPT   Acute Rehabilitation Department  Ronnie Derby 05/25/2022, 5:40 PM

## 2022-05-25 NOTE — Discharge Summary (Signed)
Physician Discharge Summary  Michael Knapp [id number] DOB: 1951-07-01 DOA: 05/24/2022  PCP: Michael Knapp  Admit date: 05/24/2022 Discharge date: 05/25/2022  Admitted From: Home Disposition::  Recommendations for Outpatient Follow-up:  Follow up with PCP in 1-2 weeks Neurology office will schedule follow-up  Home Health: N/A Equipment/Devices: N/A  Discharge Condition: Stable CODE STATUS: Full code Diet recommendation: Low-salt and low-carb diet  Discharge summary: 71 year old gentleman with history of hypertension, type 2 diabetes, stage IIIb CKD and hyperlipidemia, untreated sleep apnea presented with 1 day of intermittent right-sided numbness and found to have a small left lacunar stroke with no other complications.  He underwent extensive stroke work-up including CT scan of the head on presentation that was essentially normal. MRI of the brain consistent with acute left thalamic stroke MRA of the head with no large vessel occlusion, carotid ultrasound with no significant coronary artery disease. 2D echocardiogram was done, results are pending.  We will follow-up. Hemoglobin A1c 8.3 LDL 188. No obvious neurological deficits.  Plan: As per neurology recommendation, patient is on aspirin at home.  He will take both aspirin and Plavix for 3 weeks and then continue Plavix but to stop aspirin. Aggressive risk factor modifications including restarting treatment for sleep apnea Inconsistently taking a statin, started on high intensity statin with atorvastatin 80 mg daily Taking glipizide and metformin but inconsistently, lifestyle modifications, diet discussed.  Patient has recently become compliant to medications and would like to continue taking both medications and follow-up with PCP. We will send referral to neurology for follow-up. Able to go home today.      Discharge Diagnoses:  Principal Problem:   TIA (transient ischemic attack) Active Problems:   Type 2 diabetes  mellitus with chronic kidney disease, without long-term current use of insulin (HCC)   Essential hypertension   Sleep apnea    Discharge Instructions  Discharge Instructions     Ambulatory referral to Neurology   Complete by: As directed    An appointment is requested in approximately: 4 weeks   Diet - low sodium heart healthy   Complete by: As directed    Diet Carb Modified   Complete by: As directed    Increase activity slowly   Complete by: As directed       Allergies as of 05/25/2022   No Known Allergies      Medication List     STOP taking these medications    lovastatin 40 MG tablet Commonly known as: MEVACOR       TAKE these medications    amLODipine 5 MG tablet Commonly known as: NORVASC Take 5 mg by mouth daily.   aspirin EC 81 MG tablet Take 1 tablet (81 mg total) by mouth daily for 21 days. Swallow whole.   atorvastatin 80 MG tablet Commonly known as: LIPITOR Take 1 tablet (80 mg total) by mouth daily. Start taking on: May 26, 2022   clopidogrel 75 MG tablet Commonly known as: PLAVIX Take 1 tablet (75 mg total) by mouth daily. Start taking on: May 26, 2022   Farxiga 10 MG Tabs tablet Generic drug: dapagliflozin propanediol Take 10 mg by mouth daily.   glipiZIDE 10 MG tablet Commonly known as: GLUCOTROL Take 10 mg by mouth 2 (two) times daily.   losartan 100 MG tablet Commonly known as: COZAAR Take 100 mg by mouth daily.   metFORMIN 500 MG tablet Commonly known as: GLUCOPHAGE Take 1,000 mg by mouth 2 (two) times daily.   Vitamin D (  Ergocalciferol) 1.25 MG (50000 UNIT) Caps capsule Commonly known as: DRISDOL Take 50,000 Units by mouth every Saturday.        No Known Allergies  Consultations: Neurology   Procedures/Studies: VAS US CAROTID  Result Date: 05/24/2022 Carotid Arterial Duplex Study Patient Name:  Michael Knapp  Date of Exam:   05/24/2022 Medical Rec #: [id number]      Accession #:    [id number] Date of Birth:  1951-08-09      Patient Gender: M Patient Age:   35 years Exam Location:  [facility] Procedure:      VAS US CAROTID Referring Phys: Michael Knapp --------------------------------------------------------------------------------  Indications:  CVA. Risk Factors: Hypertension, hyperlipidemia, Diabetes. Performing Technologist: Michael Knapp  Examination Guidelines: A complete evaluation includes B-mode imaging, spectral Doppler, color Doppler, and power Doppler as needed of all accessible portions of each vessel. Bilateral testing is considered an integral part of a complete examination. Limited examinations for reoccurring indications may be performed as noted.  Right Carotid Findings: +----------+--------+--------+--------+------------------+--------+           PSV cm/sEDV cm/sStenosisPlaque DescriptionComments +----------+--------+--------+--------+------------------+--------+ CCA Prox  79      12                                         +----------+--------+--------+--------+------------------+--------+ CCA Distal93      19                                         +----------+--------+--------+--------+------------------+--------+ ICA Prox  70      12              heterogenous               +----------+--------+--------+--------+------------------+--------+ ECA       108     20                                         +----------+--------+--------+--------+------------------+--------+ +----------+--------+-------+----------------+-------------------+           PSV cm/sEDV cmsDescribe        Arm Pressure (mmHG) +----------+--------+-------+----------------+-------------------+ [id number]            Multiphasic, WNL                    +----------+--------+-------+----------------+-------------------+ +---------+--------+--+--------+--+---------+ VertebralPSV cm/s46EDV cm/s12Antegrade +---------+--------+--+--------+--+---------+  Left Carotid  Findings: +----------+--------+--------+--------+------------------+--------+           PSV cm/sEDV cm/sStenosisPlaque DescriptionComments +----------+--------+--------+--------+------------------+--------+ CCA Prox  97      23                                         +----------+--------+--------+--------+------------------+--------+ CCA Distal100     24                                         +----------+--------+--------+--------+------------------+--------+ ICA Prox  62      17              heterogenous               +----------+--------+--------+--------+------------------+--------+  ICA Distal40      10                                         +----------+--------+--------+--------+------------------+--------+ ECA       98      9                                          +----------+--------+--------+--------+------------------+--------+ +----------+--------+--------+----------------+-------------------+           PSV cm/sEDV cm/sDescribe        Arm Pressure (mmHG) +----------+--------+--------+----------------+-------------------+ Subclavian                Multiphasic, WNL                    +----------+--------+--------+----------------+-------------------+ +---------+--------+--+--------+--+---------+ VertebralPSV cm/s42EDV cm/s12Antegrade +---------+--------+--+--------+--+---------+   Summary: Right Carotid: Velocities in the right ICA are consistent with a 1-39% stenosis. Left Carotid: Velocities in the left ICA are consistent with a 1-39% stenosis. Vertebrals:  Bilateral vertebral arteries demonstrate antegrade flow. Subclavians: Normal flow hemodynamics were seen in bilateral subclavian              arteries. *See table(s) above for measurements and observations.     Preliminary    MR BRAIN WO CONTRAST  Result Date: 05/24/2022 CLINICAL DATA:  Neuro deficit, acute, stroke suspected. Right-sided numbness. EXAM: MRI HEAD WITHOUT CONTRAST MRA HEAD  WITHOUT CONTRAST TECHNIQUE: Multiplanar, multi-echo pulse sequences of the brain and surrounding structures were acquired without intravenous contrast. Angiographic images of the Circle of Willis were acquired using MRA technique without intravenous contrast. COMPARISON:  Head CT 05/24/2022 FINDINGS: MRI HEAD FINDINGS Brain: There is a 7 mm acute infarct in the inferolateral aspect of the left thalamus. No intracranial hemorrhage, midline shift, or extra-axial fluid collection is identified. A small curvilinear lipoma is noted along the splenium of the corpus callosum. Small T2 hyperintensities scattered throughout the cerebral white matter bilaterally are nonspecific but compatible with mild to moderate chronic small vessel ischemic disease. There is mild cerebral atrophy. Vascular: Major intracranial vascular flow voids are preserved. Skull and upper cervical spine: Unremarkable bone marrow signal para Sinuses/Orbits: Unremarkable orbits. Clear paranasal sinuses. Trace bilateral mastoid effusions. Other: None. MRA HEAD FINDINGS Anterior circulation: The internal carotid arteries are widely patent from skull base to carotid termini. ACAs and MCAs are patent without evidence of a proximal branch occlusion or significant proximal stenosis. No aneurysm is identified. Posterior circulation: The included intracranial portions of the vertebral arteries are widely patent to the basilar. Patent PICAs are partially visualized bilaterally. SCAs are patent bilaterally. The basilar artery is widely patent. Posterior communicating arteries are diminutive or absent. Both PCAs are patent without evidence of a significant proximal stenosis. No aneurysm is identified. Anatomic variants: None. IMPRESSION: 1. Acute left thalamic infarct. 2. 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 3. Small pericallosal lipoma. 4. Negative head MRA. Electronically Signed   By: Logan Bores M.D.   On: 05/24/2022 16:47   MR ANGIO HEAD  WO CONTRAST  Result Date: 05/24/2022 CLINICAL DATA:  Neuro deficit, acute, stroke suspected. Right-sided numbness. EXAM: MRI HEAD WITHOUT CONTRAST MRA HEAD WITHOUT CONTRAST TECHNIQUE: Multiplanar, multi-echo pulse sequences of the brain and surrounding structures were acquired without intravenous contrast. Angiographic images of the Circle of Willis were acquired  using MRA technique without intravenous contrast. COMPARISON:  Head CT 05/24/2022 FINDINGS: MRI HEAD FINDINGS Brain: There is a 7 mm acute infarct in the inferolateral aspect of the left thalamus. No intracranial hemorrhage, midline shift, or extra-axial fluid collection is identified. A small curvilinear lipoma is noted along the splenium of the corpus callosum. Small T2 hyperintensities scattered throughout the cerebral white matter bilaterally are nonspecific but compatible with mild to moderate chronic small vessel ischemic disease. There is mild cerebral atrophy. Vascular: Major intracranial vascular flow voids are preserved. Skull and upper cervical spine: Unremarkable bone marrow signal para Sinuses/Orbits: Unremarkable orbits. Clear paranasal sinuses. Trace bilateral mastoid effusions. Other: None. MRA HEAD FINDINGS Anterior circulation: The internal carotid arteries are widely patent from skull base to carotid termini. ACAs and MCAs are patent without evidence of a proximal branch occlusion or significant proximal stenosis. No aneurysm is identified. Posterior circulation: The included intracranial portions of the vertebral arteries are widely patent to the basilar. Patent PICAs are partially visualized bilaterally. SCAs are patent bilaterally. The basilar artery is widely patent. Posterior communicating arteries are diminutive or absent. Both PCAs are patent without evidence of a significant proximal stenosis. No aneurysm is identified. Anatomic variants: None. IMPRESSION: 1. Acute left thalamic infarct. 2. Mild-to-moderate chronic small vessel  ischemic disease. 3. Small pericallosal lipoma. 4. Negative head MRA. Electronically Signed   By: Logan Bores M.D.   On: 05/24/2022 16:47   CT Head Wo Contrast  Result Date: 05/24/2022 CLINICAL DATA:  TIA, right leg numbness EXAM: CT HEAD WITHOUT CONTRAST TECHNIQUE: Contiguous axial images were obtained from the base of the skull through the vertex without intravenous contrast. RADIATION DOSE REDUCTION: This exam was performed according to the departmental dose-optimization program which includes automated exposure control, adjustment of the mA and/or kV according to patient size and/or use of iterative reconstruction technique. COMPARISON:  None Available. FINDINGS: Brain: No evidence of acute infarction, hemorrhage, hydrocephalus, extra-axial collection or mass lesion/mass effect. Vascular: No hyperdense vessel or unexpected calcification. Skull: Normal. Negative for fracture or focal lesion. Sinuses/Orbits: No acute finding. Other: None. IMPRESSION: No acute intracranial pathology. No non-contrast CT findings to explain right-sided numbness. Consider MRI to more sensitively evaluate for acute diffusion restricting infarction. Electronically Signed   By: Delanna Ahmadi M.D.   On: 05/24/2022 10:22   (Echo, Carotid, EGD, Colonoscopy, ERCP)    Subjective: Patient seen and examined.  Eager to go home.  Minimal or no numbness of the right hand.   Discharge Exam: Vitals:   05/25/22 1213 05/25/22 1604  BP: (!) 146/87 (!) 168/92  Pulse: 81 80  Resp:  18  Temp: 98 F (36.7 C) 98 F (36.7 C)  SpO2: 98% 98%   Vitals:   05/25/22 0529 05/25/22 0800 05/25/22 1213 05/25/22 1604  BP: 137/87 (!) 142/76 (!) 146/87 (!) 168/92  Pulse: 73 93 81 80  Resp: 18 18  18   Temp: 97.8 F (36.6 C) 98.1 F (36.7 C) 98 F (36.7 C) 98 F (36.7 C)  TempSrc: Oral     SpO2: 98% 100% 98% 98%  Weight:      Height:        General: Pt is alert, awake, not in acute distress Cardiovascular: RRR, S1/S2 +, no rubs, no  gallops Respiratory: CTA bilaterally, no wheezing, no rhonchi Abdominal: Soft, NT, ND, bowel sounds + Extremities: no edema, no cyanosis Does not have any demonstrable neurological deficits.    The results of significant diagnostics from this hospitalization (including imaging,  microbiology, ancillary and laboratory) are listed below for reference.     Microbiology: No results found for this or any previous visit (from the past 240 hour(s)).   Labs: BNP (last 3 results) No results for input(s): "BNP" in the last 8760 hours. Basic Metabolic Panel: Recent Labs  Lab 05/24/22 0922  NA 138  K 4.4  CL 105  CO2 21*  GLUCOSE 187*  BUN 34*  CREATININE 1.83*  CALCIUM 9.8   Liver Function Tests: Recent Labs  Lab 05/24/22 0922  AST 20  ALT 20  ALKPHOS 80  BILITOT 0.5  PROT 7.2  ALBUMIN 4.2   No results for input(s): "LIPASE", "AMYLASE" in the last 168 hours. No results for input(s): "AMMONIA" in the last 168 hours. CBC: Recent Labs  Lab 05/24/22 0922  WBC 6.2  NEUTROABS 4.1  HGB 15.3  HCT 47.4  MCV 91.7  PLT 248   Cardiac Enzymes: No results for input(s): "CKTOTAL", "CKMB", "CKMBINDEX", "TROPONINI" in the last 168 hours. BNP: Invalid input(s): "POCBNP" CBG: Recent Labs  Lab 05/24/22 1725 05/24/22 2151 05/25/22 0635 05/25/22 1211 05/25/22 1603  GLUCAP 90 261* 165* 245* 245*   D-Dimer No results for input(s): "DDIMER" in the last 72 hours. Hgb A1c Recent Labs    05/24/22 1723  HGBA1C 8.3*   Lipid Profile Recent Labs    05/24/22 1723  CHOL 263*  HDL 48  LDLCALC 188*  TRIG 133  CHOLHDL 5.5   Thyroid function studies No results for input(s): "TSH", "T4TOTAL", "T3FREE", "THYROIDAB" in the last 72 hours.  Invalid input(s): "FREET3" Anemia work up No results for input(s): "VITAMINB12", "FOLATE", "FERRITIN", "TIBC", "IRON", "RETICCTPCT" in the last 72 hours. Urinalysis    Component Value Date/Time   COLORURINE STRAW (A) 05/24/2022 1723    APPEARANCEUR CLEAR 05/24/2022 1723   LABSPEC 1.016 05/24/2022 1723   PHURINE 6.0 05/24/2022 1723   GLUCOSEU >=500 (A) 05/24/2022 1723   HGBUR NEGATIVE 05/24/2022 1723   BILIRUBINUR NEGATIVE 05/24/2022 1723   KETONESUR NEGATIVE 05/24/2022 1723   PROTEINUR 100 (A) 05/24/2022 1723   NITRITE NEGATIVE 05/24/2022 1723   LEUKOCYTESUR NEGATIVE 05/24/2022 1723   Sepsis Labs Recent Labs  Lab 05/24/22 0922  WBC 6.2   Microbiology No results found for this or any previous visit (from the past 240 hour(s)).   Time coordinating discharge: 32 minutes  SIGNED:   Barb Merino, MD  [facility] 05/25/2022, 4:10 PM

## 2022-06-13 ENCOUNTER — Telehealth: Payer: Self-pay | Admitting: Neurology

## 2022-06-13 NOTE — Telephone Encounter (Signed)
LVM and sent mychart msg advising pt of r/s needed for 9/18- MD out.

## 2022-07-09 ENCOUNTER — Encounter: Payer: Self-pay | Admitting: Neurology

## 2022-07-09 ENCOUNTER — Ambulatory Visit: Payer: Medicare Other | Admitting: Neurology

## 2022-07-09 VITALS — BP 150/79 | HR 79 | Ht 73.34 in | Wt 208.2 lb

## 2022-07-09 DIAGNOSIS — I6381 Other cerebral infarction due to occlusion or stenosis of small artery: Secondary | ICD-10-CM

## 2022-07-09 DIAGNOSIS — R202 Paresthesia of skin: Secondary | ICD-10-CM

## 2022-07-09 NOTE — Patient Instructions (Signed)
I had a long d/w patient about his recent lacunar thalamic stroke, post stroke paresthesias,risk for recurrent stroke/TIAs, personally independently reviewed imaging studies and stroke evaluation results and answered questions.Continue Plavix 75 mg daily  for secondary stroke prevention and maintain strict control of hypertension with blood pressure goal below 130/90, diabetes with hemoglobin A1c goal below 6.5% and lipids with LDL cholesterol goal below 70 mg/dL. I also advised the patient to eat a healthy diet with plenty of whole grains, cereals, fruits and vegetables, exercise regularly and maintain ideal body weight .check follow-up lipid profile and if not satisfactory may consider adding Repatha or Praluent injections for lipid control.  Followup in the future with me in 6 months or call earlier if necessary. Stroke Prevention Some medical conditions and behaviors can lead to a higher chance of having a stroke. You can help prevent a stroke by eating healthy, exercising, not smoking, and managing any medical conditions you have. Stroke is a leading cause of functional impairment. Primary prevention is particularly important because a majority of strokes are first-time events. Stroke changes the lives of not only those who experience a stroke but also their family and other caregivers. How can this condition affect me? A stroke is a medical emergency and should be treated right away. A stroke can lead to brain damage and can sometimes be life-threatening. If a person gets medical treatment right away, there is a better chance of surviving and recovering from a stroke. What can increase my risk? The following medical conditions may increase your risk of a stroke: Cardiovascular disease. High blood pressure (hypertension). Diabetes. High cholesterol. Sickle cell disease. Blood clotting disorders (hypercoagulable state). Obesity. Sleep disorders (obstructive sleep apnea). Other risk factors  include: Being older than age 73. Having a history of blood clots, stroke, or mini-stroke (transient ischemic attack, TIA). Genetic factors, such as race, ethnicity, or a family history of stroke. Smoking cigarettes or using other tobacco products. Taking birth control pills, especially if you also use tobacco. Heavy use of alcohol or drugs, especially cocaine and methamphetamine. Physical inactivity. What actions can I take to prevent this? Manage your health conditions High cholesterol levels. Eating a healthy diet is important for preventing high cholesterol. If cholesterol cannot be managed through diet alone, you may need to take medicines. Take any prescribed medicines to control your cholesterol as told by your health care provider. Hypertension. To reduce your risk of stroke, try to keep your blood pressure below 130/80. Eating a healthy diet and exercising regularly are important for controlling blood pressure. If these steps are not enough to manage your blood pressure, you may need to take medicines. Take any prescribed medicines to control hypertension as told by your health care provider. Ask your health care provider if you should monitor your blood pressure at home. Have your blood pressure checked every year, even if your blood pressure is normal. Blood pressure increases with age and some medical conditions. Diabetes. Eating a healthy diet and exercising regularly are important parts of managing your blood sugar (glucose). If your blood sugar cannot be managed through diet and exercise, you may need to take medicines. Take any prescribed medicines to control your diabetes as told by your health care provider. Get evaluated for obstructive sleep apnea. Talk to your health care provider about getting a sleep evaluation if you snore a lot or have excessive sleepiness. Make sure that any other medical conditions you have, such as atrial fibrillation or atherosclerosis, are  managed. Nutrition Follow  instructions from your health care provider about what to eat or drink to help manage your health condition. These instructions may include: Reducing your daily calorie intake. Limiting how much salt (sodium) you use to 1,500 milligrams (mg) each day. Using only healthy fats for cooking, such as olive oil, canola oil, or sunflower oil. Eating healthy foods. You can do this by: Choosing foods that are high in fiber, such as whole grains, and fresh fruits and vegetables. Eating at least 5 servings of fruits and vegetables a day. Try to fill one-half of your plate with fruits and vegetables at each meal. Choosing lean protein foods, such as lean cuts of meat, poultry without skin, fish, tofu, beans, and nuts. Eating low-fat dairy products. Avoiding foods that are high in sodium. This can help lower blood pressure. Avoiding foods that have saturated fat, trans fat, and cholesterol. This can help prevent high cholesterol. Avoiding processed and prepared foods. Counting your daily carbohydrate intake.  Lifestyle If you drink alcohol: Limit how much you have to: 0-1 drink a day for women who are not pregnant. 0-2 drinks a day for men. Know how much alcohol is in your drink. In the [place] one drink equals one 12 oz bottle of beer (389m), one 5 oz glass of wine (1465m, or one 1 oz glass of hard liquor (4414m Do not use any products that contain nicotine or tobacco. These products include cigarettes, chewing tobacco, and vaping devices, such as e-cigarettes. If you need help quitting, ask your health care provider. Avoid secondhand smoke. Do not use drugs. Activity  Try to stay at a healthy weight. Get at least 30 minutes of exercise on most days, such as: Fast walking. Biking. Swimming. Medicines Take over-the-counter and prescription medicines only as told by your health care provider. Aspirin or blood thinners (antiplatelets or anticoagulants) may be recommended  to reduce your risk of forming blood clots that can lead to stroke. Avoid taking birth control pills. Talk to your health care provider about the risks of taking birth control pills if: You are over 35 52ars old. You smoke. You get very bad headaches. You have had a blood clot. Where to find more information American Stroke Association: www.strokeassociation.org Get help right away if: You or a loved one has any symptoms of a stroke. "BE FAST" is an easy way to remember the main warning signs of a stroke: B - Balance. Signs are dizziness, sudden trouble walking, or loss of balance. E - Eyes. Signs are trouble seeing or a sudden change in vision. F - Face. Signs are sudden weakness or numbness of the face, or the face or eyelid drooping on one side. A - Arms. Signs are weakness or numbness in an arm. This happens suddenly and usually on one side of the body. S - Speech. Signs are sudden trouble speaking, slurred speech, or trouble understanding what people say. T - Time. Time to call emergency services. Write down what time symptoms started. You or a loved one has other signs of a stroke, such as: A sudden, severe headache with no known cause. Nausea or vomiting. Seizure. These symptoms may represent a serious problem that is an emergency. Do not wait to see if the symptoms will go away. Get medical help right away. Call your local emergency services (911 in the [place] Do not drive yourself to the hospital. Summary You can help to prevent a stroke by eating healthy, exercising, not smoking, limiting alcohol intake, and managing any medical conditions  you may have. Do not use any products that contain nicotine or tobacco. These include cigarettes, chewing tobacco, and vaping devices, such as e-cigarettes. If you need help quitting, ask your health care provider. Remember "BE FAST" for warning signs of a stroke. Get help right away if you or a loved one has any of these signs. This information  is not intended to replace advice given to you by your health care provider. Make sure you discuss any questions you have with your health care provider. Document Revised: 07/03/2020 Document Reviewed: 07/03/2020 Elsevier Patient Education  [place].

## 2022-07-09 NOTE — Progress Notes (Signed)
[facility] [address] [place]. [place] [address] [phone]       OFFICE FOLLOW-UP NOTE  Mr. EFREM PITSTICK Date of Birth:  Sep 25, 1951 Medical Record Number:  [id number]   HPI: Mr. Candela is a 71 year old pleasant Caucasian male seen today for initial office follow-up visit for stroke.  History is obtained from the patient and review of electronic medical records and opossum reviewed pertinent available imaging films in PACS.  He has past medical history of diabetes, hypertension, hyperlipidemia, obstructive sleep apnea and stage IIIb chronic kidney disease.  He presented on 05/24/2022 with sudden onset of left-sided numbness the day prior.  At 2 PM the day prior he previously had episode of right body numbness which resolved spontaneously.  Around 9 PM the same night the numbness returned with some tingling in the arm.  It appeared to be gradually improving by the time he came to the hospital.  CT scan on admission was unremarkable and MRI scan showed a small punctate left thalamic lacunar infarct.  MR angiogram of the brain showed no large vessel stenosis or occlusion and carotid ultrasound showed no significant extracranial stenosis.  LDL cholesterol was elevated at 188 mg percent and hemoglobin A1c at 8.3.  Echocardiogram showed ejection fraction of 60 to 65% without cardiac source of embolism.  He was started on dual antiplatelet therapy aspirin and Plavix for 3 weeks and then asked to stay on Plavix alone as he was on aspirin prior to admission.  Patient states is done well since discharge.  He still has only very minimum numbness in the lateral aspect of the right foot but it is not bothersome.  He is made full recovery otherwise.  He has been working with risk factor modification with his primary physician.  His last A1c was down to 8.0 but sugars remain fluctuating and high.  His blood pressures also been quite fluctuating today it is 150/79.  He is tolerating Lipitor 80 mg  well without any side effects he was previously on Pravachol and cholesterol was not controlled with that.  He denies any prior history of strokes or significant neurological problems.  There is no family history of stroke either.  He does not smoke or drink.  He has no new complaints today.  ROS:   14 system review of systems is positive for numbness, bruising and all other systems negative  PMH:  Past Medical History:  Diagnosis Date   Diabetes mellitus without complication (HCC)    High cholesterol    Hypertension     Social History:  Social History   Socioeconomic History   Marital status: Married    Spouse name: Not on file   Number of children: Not on file   Years of education: Not on file   Highest education level: Not on file  Occupational History   Not on file  Tobacco Use   Smoking status: Former    Types: Cigarettes   Smokeless tobacco: Never  Substance and Sexual Activity   Alcohol use: Not Currently   Drug use: Not Currently   Sexual activity: Not on file  Other Topics Concern   Not on file  Social History Narrative   Not on file   Social Determinants of Health   Financial Resource Strain: Not on file  Food Insecurity: Not on file  Transportation Needs: Not on file  Physical Activity: Not on file  Stress: Not on file  Social Connections: Not on file  Intimate Partner Violence:  Not on file    Medications:   Current Outpatient Medications on File Prior to Visit  Medication Sig Dispense Refill   amLODipine (NORVASC) 5 MG tablet Take 5 mg by mouth daily.     atorvastatin (LIPITOR) 80 MG tablet Take 1 tablet (80 mg total) by mouth daily. 30 tablet 2   clopidogrel (PLAVIX) 75 MG tablet Take 1 tablet (75 mg total) by mouth daily. 30 tablet 2   FARXIGA 10 MG TABS tablet Take 10 mg by mouth daily.     glipiZIDE (GLUCOTROL) 10 MG tablet Take 10 mg by mouth 2 (two) times daily.     losartan (COZAAR) 100 MG tablet Take 100 mg by mouth daily.     metFORMIN  (GLUCOPHAGE) 500 MG tablet Take 1,000 mg by mouth 2 (two) times daily.     Vitamin D, Ergocalciferol, (DRISDOL) 1.25 MG (50000 UNIT) CAPS capsule Take 50,000 Units by mouth every Saturday.     No current facility-administered medications on file prior to visit.    Allergies:  No Known Allergies  Physical Exam General: well developed, well nourished middle-aged Caucasian male, seated, in no evident distress Head: head normocephalic and atraumatic.  Neck: supple with no carotid or supraclavicular bruits Cardiovascular: regular rate and rhythm, no murmurs Musculoskeletal: no deformity Skin:  no rash/petichiae Vascular:  Normal pulses all extremities Vitals:   07/09/22 1107  BP: (!) 150/79  Pulse: 79   Neurologic Exam Mental Status: Awake and fully alert. Oriented to place and time. Recent and remote memory intact. Attention span, concentration and fund of knowledge appropriate. Mood and affect appropriate.  Cranial Nerves: Fundoscopic exam reveals sharp disc margins. Pupils equal, briskly reactive to light. Extraocular movements full without nystagmus. Visual fields full to confrontation. Hearing intact. Facial sensation intact. Face, tongue, palate moves normally and symmetrically.  Motor: Normal bulk and tone. Normal strength in all tested extremity muscles. Sensory.: intact to touch ,pinprick .position and vibratory sensation.  Mild paresthesia in the lateral aspect of the right foot but no objective sensory loss Coordination: Rapid alternating movements normal in all extremities. Finger-to-nose and heel-to-shin performed accurately bilaterally. Gait and Station: Arises from chair without difficulty. Stance is normal. Gait demonstrates normal stride length and balance . Able to heel, toe and tandem walk with slight difficulty.  Reflexes: 1+ and symmetric. Toes downgoing.   NIHSS  0 Modified Rankin  1   ASSESSMENT: 71 year old male with left thalamic lacunar infarct in June 2023 from  small vessel disease.  He is doing very well with only minor residual paresthesias.  Vascular risk factors of uncontrolled diabetes, hypertension and hyperlipidemia     PLAN: I had a long d/w patient about his recent lacunar thalamic stroke, post stroke paresthesias,risk for recurrent stroke/TIAs, personally independently reviewed imaging studies and stroke evaluation results and answered questions.Continue Plavix 75 mg daily  for secondary stroke prevention and maintain strict control of hypertension with blood pressure goal below 130/90, diabetes with hemoglobin A1c goal below 6.5% and lipids with LDL cholesterol goal below 70 mg/dL. I also advised the patient to eat a healthy diet with plenty of whole grains, cereals, fruits and vegetables, exercise regularly and maintain ideal body weight .check follow-up lipid profile and if not satisfactory may consider adding Repatha or Praluent injections for lipid control.  Followup in the future with me in 6 months or call earlier if necessary. Greater than 50% of time during this 35 minute visit was spent on counseling,explanation of diagnosis of thalamic stroke, planning  of further management, discussion with patient and family and coordination of care Antony Contras, MD Note: This document was prepared with digital dictation and possible smart phrase technology. Any transcriptional errors that result from this process are unintentional

## 2022-07-10 ENCOUNTER — Telehealth: Payer: Self-pay | Admitting: *Deleted

## 2022-07-10 LAB — LIPID PANEL
Chol/HDL Ratio: 3.6 ratio (ref 0.0–5.0)
Cholesterol, Total: 142 mg/dL (ref 100–199)
HDL: 40 mg/dL (ref >39)
LDL Chol Calc (NIH): 70 mg/dL (ref 0–99)
Triglycerides: 192 mg/dL — ABNORMAL HIGH (ref 0–149)
VLDL Cholesterol Cal: 32 mg/dL (ref 5–40)

## 2022-07-10 NOTE — Progress Notes (Signed)
Kindly inform the patient that cholesterol profile is mostly satisfactory except elevated triglycerides.  He needs to reduce his alcohol intake if he drinks and seek advice from primary care physician to bring this down.

## 2022-07-10 NOTE — Telephone Encounter (Signed)
-----   Message from Micki Riley, MD sent at 07/10/2022  8:31 AM EDT ----- Joneen Roach inform the patient that cholesterol profile is mostly satisfactory except elevated triglycerides.  He needs to reduce his alcohol intake if he drinks and seek advice from primary care physician to bring this down.

## 2022-07-10 NOTE — Telephone Encounter (Signed)
I called the pt and relayed results via vm ( ok per dpr). Pt advised to call back with any questions and I have sent report to PCP office.

## 2022-08-05 ENCOUNTER — Inpatient Hospital Stay: Payer: Medicare Other | Admitting: Neurology

## 2022-08-06 ENCOUNTER — Inpatient Hospital Stay: Payer: Medicare Other | Admitting: Neurology

## 2022-09-02 ENCOUNTER — Inpatient Hospital Stay: Payer: Medicare Other | Admitting: Neurology

## 2022-09-10 ENCOUNTER — Telehealth: Payer: Self-pay | Admitting: Neurology

## 2022-09-10 NOTE — Telephone Encounter (Signed)
Pt is calling. Stated medication clopidogrel has ran out, Pt states a doctor at the hospital wrote this prescription. Pt wants to know if doctor Dr. Leonie Man will write this prescription for him.

## 2022-09-10 NOTE — Telephone Encounter (Signed)
As per Dr. Clydene Fake last note, "Continue Plavix 75 mg daily for secondary stroke prevention".

## 2022-09-16 ENCOUNTER — Inpatient Hospital Stay: Payer: Medicare Other | Admitting: Neurology

## 2022-09-18 ENCOUNTER — Telehealth: Payer: Self-pay | Admitting: Neurology

## 2022-09-18 MED ORDER — CLOPIDOGREL BISULFATE 75 MG PO TABS: 75.0000 mg | ORAL_TABLET | Freq: Every day | ORAL | 0 refills | Status: DC

## 2022-09-18 NOTE — Telephone Encounter (Signed)
Will refill rx as per last OV note from Dr. Leonie Man "Continue Plavix 75 mg daily for secondary stroke prevention". Patient has a follow up appointment on 01/20/2023.

## 2022-09-18 NOTE — Telephone Encounter (Signed)
Pt is calling. Stated he called on 9/25 and requested a refill on Clopidogrel Bisulfate  but it was never sent in to the pharmacy.  Requesting refill be sent to [id number]

## 2022-09-20 ENCOUNTER — Other Ambulatory Visit: Payer: Self-pay | Admitting: Neurology

## 2022-09-20 MED ORDER — CLOPIDOGREL BISULFATE 75 MG PO TABS: 75.0000 mg | ORAL_TABLET | Freq: Every day | ORAL | 1 refills | Status: DC

## 2023-01-20 ENCOUNTER — Ambulatory Visit: Payer: Medicare Other | Admitting: Neurology

## 2023-04-18 ENCOUNTER — Other Ambulatory Visit: Payer: Self-pay | Admitting: Neurology

## 2023-07-23 ENCOUNTER — Other Ambulatory Visit: Payer: Self-pay | Admitting: Neurology
